# Patient Record
Sex: Male | Born: 1950 | Race: White | Hispanic: No | Marital: Married | Smoking: Former smoker
Health system: Southern US, Community
[De-identification: ages and names within clinical notes are randomized; demographics above are authoritative.]

## PROBLEM LIST (undated history)

## (undated) DIAGNOSIS — I10 Essential (primary) hypertension: Secondary | ICD-10-CM

## (undated) DIAGNOSIS — M1990 Unspecified osteoarthritis, unspecified site: Secondary | ICD-10-CM

## (undated) DIAGNOSIS — K219 Gastro-esophageal reflux disease without esophagitis: Secondary | ICD-10-CM

## (undated) HISTORY — PX: HERNIA REPAIR: SHX51

## (undated) HISTORY — PX: JOINT REPLACEMENT: SHX530

---

## 1995-04-24 HISTORY — PX: SHOULDER SURGERY: SHX246

## 2015-05-10 ENCOUNTER — Emergency Department: Payer: BC Managed Care – PPO

## 2015-05-10 ENCOUNTER — Emergency Department
Admission: EM | Admit: 2015-05-10 | Discharge: 2015-05-10 | Disposition: A | Discharge status: Home / Self Care | Payer: BC Managed Care – PPO | Attending: Emergency Medicine | Admitting: Emergency Medicine

## 2015-05-10 ENCOUNTER — Encounter: Payer: Self-pay | Admitting: Emergency Medicine

## 2015-05-10 DIAGNOSIS — K8020 Calculus of gallbladder without cholecystitis without obstruction: Secondary | ICD-10-CM

## 2015-05-10 DIAGNOSIS — R1011 Right upper quadrant pain: Secondary | ICD-10-CM

## 2015-05-10 DIAGNOSIS — K8070 Calculus of gallbladder and bile duct without cholecystitis without obstruction: Secondary | ICD-10-CM | POA: Insufficient documentation

## 2015-05-10 LAB — COMPREHENSIVE METABOLIC PANEL
ALK PHOS: 64 U/L (ref 38–126)
ALT: 24 U/L (ref 17–63)
AST: 22 U/L (ref 15–41)
Albumin: 4.8 g/dL (ref 3.5–5.0)
Anion gap: 7 (ref 5–15)
BILIRUBIN TOTAL: 0.9 mg/dL (ref 0.3–1.2)
BUN: 16 mg/dL (ref 6–20)
CALCIUM: 9.3 mg/dL (ref 8.9–10.3)
CO2: 28 mmol/L (ref 22–32)
CREATININE: 0.95 mg/dL (ref 0.61–1.24)
Chloride: 104 mmol/L (ref 101–111)
GFR calc Af Amer: >60 mL/min (ref >60)
Glucose, Bld: 105 mg/dL — ABNORMAL HIGH (ref 65–99)
Potassium: 4.2 mmol/L (ref 3.5–5.1)
Sodium: 139 mmol/L (ref 135–145)
TOTAL PROTEIN: 7.9 g/dL (ref 6.5–8.1)

## 2015-05-10 LAB — URINALYSIS COMPLETE WITH MICROSCOPIC (ARMC ONLY)
BILIRUBIN URINE: NEGATIVE
Bacteria, UA: NONE SEEN
GLUCOSE, UA: NEGATIVE mg/dL
HGB URINE DIPSTICK: NEGATIVE
KETONES UR: NEGATIVE mg/dL
LEUKOCYTES UA: NEGATIVE
NITRITE: NEGATIVE
PH: 7 (ref 5.0–8.0)
Protein, ur: NEGATIVE mg/dL
Specific Gravity, Urine: 1.008 (ref 1.005–1.030)
Squamous Epithelial / LPF: NONE SEEN

## 2015-05-10 LAB — CBC
HCT: 49.9 % (ref 40.0–52.0)
Hemoglobin: 16.6 g/dL (ref 13.0–18.0)
MCH: 29.9 pg (ref 26.0–34.0)
MCHC: 33.2 g/dL (ref 32.0–36.0)
MCV: 90 fL (ref 80.0–100.0)
PLATELETS: 194 10*3/uL (ref 150–440)
RBC: 5.54 MIL/uL (ref 4.40–5.90)
RDW: 13.5 % (ref 11.5–14.5)
WBC: 6.3 10*3/uL (ref 3.8–10.6)

## 2015-05-10 LAB — TROPONIN I: Troponin I: <0.03 ng/mL (ref <0.031)

## 2015-05-10 LAB — LIPASE, BLOOD: Lipase: 33 U/L (ref 11–51)

## 2015-05-10 NOTE — ED Provider Notes (Signed)
[facility] Emergency Department Provider Note  ____________________________________________  Time seen: Approximately [id number] AM  I have reviewed the triage vital signs and the nursing notes.   HISTORY  Chief Complaint Abdominal Pain    HPI Bora Ursini is a 65 y.o. male presenting with 4 days of right upper quadrant and right flank pain with worsening this a.m. He says it is worsening with movement and feels like a "spasm." It does not worsen with eating. He has not had any nausea or vomiting. He still has his gallbladder. He denies any pain at this time. He says the pain is been intermittent. No chest pain or shortness of breath.No history of kidney stones. Denies any dysuria.   No past medical history on file.  There are no active problems to display for this patient.   No past surgical history on file.  No current outpatient prescriptions on file.  Allergies Review of patient's allergies indicates no known allergies.  No family history on file.  Social History Social History  Substance Use Topics  . Smoking status: Never Smoker   . Smokeless tobacco: Not on file  . Alcohol Use: Yes    Review of Systems Constitutional: No fever/chills Eyes: No visual changes. ENT: No sore throat. Cardiovascular: Denies chest pain. Respiratory: Denies shortness of breath. Gastrointestinal:  No nausea, no vomiting.  No diarrhea.  No constipation. Genitourinary: Negative for dysuria. Musculoskeletal: Negative for back pain. Skin: Negative for rash. Neurological: Negative for headaches, focal weakness or numbness.  10-point ROS otherwise negative.  ____________________________________________   PHYSICAL EXAM:  VITAL SIGNS: ED Triage Vitals  Enc Vitals Group     BP 05/10/15 0848 165/99 mmHg     Pulse Rate 05/10/15 0847 88     Resp 05/10/15 0847 20     Temp 05/10/15 0847 98 F (36.7 C)     Temp Source 05/10/15 0847 Oral     SpO2 05/10/15 0847  98 %     Weight 05/10/15 0847 197 lb (89.359 kg)     Height 05/10/15 0847 5\' 8"  (1.727 m)     Head Cir --      Peak Flow --      Pain Score 05/10/15 0847 7     Pain Loc --      Pain Edu? --      Excl. in [place]? --     Constitutional: Alert and oriented. Well appearing and in no acute distress. Eyes: Conjunctivae are normal. PERRL. EOMI. Head: Atraumatic. Nose: No congestion/rhinnorhea. Mouth/Throat: Mucous membranes are moist.  Oropharynx non-erythematous. Neck: No stridor.   Cardiovascular: Normal rate, regular rhythm. Grossly normal heart sounds.  Good peripheral circulation. Respiratory: Normal respiratory effort.  No retractions. Lungs CTAB. Gastrointestinal: Soft and nontender. No distention. No abdominal bruits. No CVA tenderness. Musculoskeletal: No lower extremity tenderness nor edema.  No joint effusions. Neurologic:  Normal speech and language. No gross focal neurologic deficits are appreciated. No gait instability. Skin:  Skin is warm, dry and intact. No rash noted. Psychiatric: Mood and affect are normal. Speech and behavior are normal.  ____________________________________________   LABS (all labs ordered are listed, but only abnormal results are displayed)  Labs Reviewed  COMPREHENSIVE METABOLIC PANEL - Abnormal; Notable for the following:    Glucose, Bld 105 (*)    All other components within normal limits  URINALYSIS COMPLETEWITH MICROSCOPIC ([facility] ONLY) - Abnormal; Notable for the following:    Color, Urine YELLOW (*)    APPearance CLEAR (*)  All other components within normal limits  LIPASE, BLOOD  CBC  TROPONIN I   ____________________________________________  EKG  ED ECG REPORT I, Iasiah Ozment,  Youlanda Roys, the attending physician, personally viewed and interpreted this ECG.   Date: 05/10/2015  EKG Time: 904 AM  Rate: 84  Rhythm: normal sinus rhythm  Axis: Normal axis  Intervals:none  ST&T Change: No ST segment elevation or depression. No abnormal  T-wave inversion.  ____________________________________________  RADIOLOGY  Chest x-ray without any acute disease.  Ultrasound of the gallbladder with multiple gallstones and borderline wall thickening without oh sonographic Murphy sign or pericholecystic fluid. ____________________________________________   PROCEDURES    ____________________________________________   INITIAL IMPRESSION / ASSESSMENT AND PLAN / ED COURSE  Pertinent labs & imaging results that were available during my care of the patient were reviewed by me and considered in my medical decision making (see chart for details).  ----------------------------------------- 2:29 PM on 05/10/2015 -----------------------------------------  Discussed the case with Dr. Heath Lark. I was concerned because of the borderline wall thickening. However considering that the patient is very comfortable right now with normal labs and vital signs and without any tenderness to palpation on repeat exam he'll be discharged for outpatient follow-up. I did discuss with him the imaging findings and the concern for gallbladder pain as a cause for his presentation. He understands plan for outpatient follow-up and is willing to comply. He understands return for any worsening or concerning symptoms. Furthermore, denies any shortness of breath or chest pain or pain with deep breathing. He does have the pain is worse in the morning and with movement. I told him that he could try Aspercreme or icy hot to the area to see if it helps the symptoms but I am concerned about this being from his gallstones. ____________________________________________   FINAL CLINICAL IMPRESSION(S) / ED DIAGNOSES  Final diagnoses:  RUQ pain   Cholelithiasis.   Orbie Pyo, MD 05/10/15 [phone]

## 2015-05-10 NOTE — ED Notes (Signed)
Pt c/o pain to RUQ for the past 4 days worsening this am. Pt reports was seen at [facility] and brought to the ED via wheelchair for evaluation.

## 2015-05-10 NOTE — ED Notes (Signed)
He is to be discharged to home with follow up   Pt reports no pain at this time VSS  Continue to monitor

## 2015-05-10 NOTE — Discharge Instructions (Signed)

## 2015-05-11 ENCOUNTER — Encounter: Payer: Self-pay | Admitting: *Deleted

## 2015-05-17 ENCOUNTER — Ambulatory Visit (INDEPENDENT_AMBULATORY_CARE_PROVIDER_SITE_OTHER): Payer: BC Managed Care – PPO | Admitting: General Surgery

## 2015-05-17 ENCOUNTER — Encounter: Payer: Self-pay | Admitting: General Surgery

## 2015-05-17 VITALS — BP 150/74 | HR 84 | Resp 12 | Ht 68.0 in | Wt 197.0 lb

## 2015-05-17 DIAGNOSIS — K8010 Calculus of gallbladder with chronic cholecystitis without obstruction: Secondary | ICD-10-CM

## 2015-05-17 DIAGNOSIS — K429 Umbilical hernia without obstruction or gangrene: Secondary | ICD-10-CM

## 2015-05-17 NOTE — Progress Notes (Signed)
Patient ID: Craig Michael, male   DOB: 1950-07-12, 65 y.o.   MRN: [id number]  Chief Complaint  Patient presents with  . Other    gallstones    HPI Craig Michael is a 65 y.o. male. Patient is here for evaluation of gallstones. States that he had sharp RUQ pain 1 week ago and presented to the ED . [place] on 05/09/15 in the ED shows gall stones. Prior to this episode, he had mild RUQ and LUQ pain intermittently. Denies nausea, vomiting, diarrhea or change in bowel habits. Denies change in diet.   HPI I have reviewed the history of present illness with the patient.  History reviewed. No pertinent past medical history.  Past Surgical History  Procedure Laterality Date  . Shoulder surgery Right 1997    History reviewed. No pertinent family history.  Social History Social History  Substance Use Topics  . Smoking status: Former Smoker -- 1.00 packs/day for 20 years    Types: Cigarettes  . Smokeless tobacco: None  . Alcohol Use: 0.0 oz/week    0 Standard drinks or equivalent per week    No Known Allergies  Current Outpatient Prescriptions  Medication Sig Dispense Refill  . naproxen sodium (ANAPROX) 220 MG tablet Take 220 mg by mouth as needed.    Marland Kitchen omeprazole (PRILOSEC) 20 MG capsule Take 20 mg by mouth daily.     No current facility-administered medications for this visit.    Review of Systems Review of Systems  Constitutional: Negative.   Respiratory: Negative.   Cardiovascular: Negative.     Blood pressure 150/74, pulse 84, resp. rate 12, height 5\' 8"  (1.727 m), weight 197 lb (89.359 kg).  Physical Exam Physical Exam  Constitutional: He is oriented to person, place, and time. He appears well-developed and well-nourished.  Eyes: Conjunctivae are normal. No scleral icterus.  Neck: Neck supple.  Cardiovascular: Normal rate, regular rhythm and normal heart sounds.   Pulmonary/Chest: Effort normal and breath sounds normal.  Abdominal: Soft. Normal appearance and bowel  sounds are normal. There is no hepatomegaly. There is tenderness in the right upper quadrant. A hernia is present. Hernia confirmed negative in the right inguinal area and confirmed negative in the left inguinal area.    Lymphadenopathy:    He has no cervical adenopathy.  Neurological: He is alert and oriented to person, place, and time.  Skin: Skin is warm and dry.    Data Reviewed Notes, Labs, and [place] reviewed. He has gallstones, mild gbw thickening. No pericholecystic fluid Labs were normal. Assessment    Cholelithiasis, chronic cholecystitis Umbilical hernia    Plan   Recommended cholecystectomy, repair of umbilical hernia at same time.  Patient to have laparoscopic cholecystectomy with intraoperative cholangiogram and umbilical hernia repair.  Hernia precautions and incarceration were discussed with the patient. If they develop symptoms of an incarcerated hernia, they were encouraged to seek prompt medical attention.  I have recommended repair of the hernia using mesh on an outpatient basis in the near future. The risk of infection was reviewed. The role of prosthetic mesh to minimize the risk of recurrence was reviewed. Laparoscopic Cholecystectomy with Intraoperative Cholangiogram. The procedure, including it's potential risks and complications (including but not limited to infection, bleeding, injury to intra-abdominal organs or bile ducts, bile leak, poor cosmetic result, sepsis and death) were discussed with the patient in detail. Non-operative options, including their inherent risks (acute calculous cholecystitis with possible choledocholithiasis or gallstone pancreatitis, with the risk of ascending cholangitis, sepsis,  and death) were discussed as well. The patient expressed and understanding of what we discussed and wishes to proceed with laparoscopic cholecystectomy. The patient further understands that if it is technically not possible, or it is unsafe to proceed  laparoscopically, that I will convert to an open cholecystectomy.  Patient is scheduled for surgery at [facility] on 05/24/15. He will pre admit by phone. Patient is aware of date and instructions.     PCP:  No Pcp Per Patient\  This information has been scribed by Gaspar Cola CMA.     SANKAR,SEEPLAPUTHUR G 05/17/2015, 3:21 PM

## 2015-05-17 NOTE — Patient Instructions (Addendum)
   Cholelithiasis Cholelithiasis (also called gallstones) is a form of gallbladder disease in which gallstones form in your gallbladder. The gallbladder is an organ that stores bile made in the liver, which helps digest fats. Gallstones begin as small crystals and slowly grow into stones. Gallstone pain occurs when the gallbladder spasms and a gallstone is blocking the duct. Pain can also occur when a stone passes out of the duct.  RISK FACTORS  Being male.   Having multiple pregnancies. Health care providers sometimes advise removing diseased gallbladders before future pregnancies.   Being obese.  Eating a diet heavy in fried foods and fat.   Being older than 1 years and increasing age.   Prolonged use of medicines containing male hormones.   Having diabetes mellitus.   Rapidly losing weight.   Having a family history of gallstones (heredity).  SYMPTOMS  Nausea.   Vomiting.  Abdominal pain.   Yellowing of the skin (jaundice).   Sudden pain. It may persist from several minutes to several hours.  Fever.   Tenderness to the touch. In some cases, when gallstones do not move into the bile duct, people have no pain or symptoms. These are called "silent" gallstones.  TREATMENT Silent gallstones do not need treatment. In severe cases, emergency surgery may be required. Options for treatment include:  Surgery to remove the gallbladder. This is the most common treatment.  Medicines. These do not always work and may take 6-12 months or more to work.  Shock wave treatment (extracorporeal biliary lithotripsy). In this treatment an ultrasound machine sends shock waves to the gallbladder to break gallstones into smaller pieces that can pass into the intestines or be dissolved by medicine. HOME CARE INSTRUCTIONS   Only take over-the-counter or prescription medicines for pain, discomfort, or fever as directed by your health care provider.   Follow a low-fat diet  until seen again by your health care provider. Fat causes the gallbladder to contract, which can result in pain.   Follow up with your health care provider as directed. Attacks are almost always recurrent and surgery is usually required for permanent treatment.  SEEK IMMEDIATE MEDICAL CARE IF:   Your pain increases and is not controlled by medicines.   You have a fever or persistent symptoms for more than 2-3 days.   You have a fever and your symptoms suddenly get worse.   You have persistent nausea and vomiting.  MAKE SURE YOU:   Understand these instructions.  Will watch your condition.  Will get help right away if you are not doing well or get worse.   This information is not intended to replace advice given to you by your health care provider. Make sure you discuss any questions you have with your health care provider.   Document Released: 04/05/2005 Document Revised: 12/10/2012 Document Reviewed: 10/01/2012 Elsevier Interactive Patient Education [employer].  Patient is scheduled for surgery at [facility] on 05/24/15. He will pre admit by phone. Patient is aware of date and instructions.

## 2015-05-19 ENCOUNTER — Encounter: Payer: Self-pay | Admitting: *Deleted

## 2015-05-19 ENCOUNTER — Inpatient Hospital Stay: Admission: RE | Admit: 2015-05-19 | Payer: BC Managed Care – PPO | Source: Ambulatory Visit

## 2015-05-19 NOTE — Patient Instructions (Signed)
  Your procedure is scheduled on: 05-24-15 Report to [place]. To find out your arrival time please call [phone] between 1PM - 3PM on 05-23-15  Remember: Instructions that are not followed completely may result in serious medical risk, up to and including death, or upon the discretion of your surgeon and anesthesiologist your surgery may need to be rescheduled.    _X___ 1. Do not eat food or drink liquids after midnight. No gum chewing or hard candies.     _X___ 2. No Alcohol for 24 hours before or after surgery.   ____ 3. Bring all medications with you on the day of surgery if instructed.    ____ 4. Notify your doctor if there is any change in your medical condition     (cold, fever, infections).     Do not wear jewelry, make-up, hairpins, clips or nail polish.  Do not wear lotions, powders, or perfumes. You may wear deodorant.  Do not shave 48 hours prior to surgery. Men may shave face and neck.  Do not bring valuables to the hospital.    [facility] is not responsible for any belongings or valuables.               Contacts, dentures or bridgework may not be worn into surgery.  Leave your suitcase in the car. After surgery it may be brought to your room.  For patients admitted to the hospital, discharge time is determined by your treatment team.   Patients discharged the day of surgery will not be allowed to drive home.   Please read over the following fact sheets that you were given:      __X__ Take these medicines the morning of surgery with A SIP OF WATER:    1. OMEPRAZOLE  2. TAKE AN EXTRA OMEPRAZOLE ON Monday NIGHT  3.   4.  5.  6.  ____ Fleet Enema (as directed)   ____ Use CHG Soap as directed  ____ Use inhalers on the day of surgery  ____ Stop metformin 2 days prior to surgery    ____ Take 1/2 of usual insulin dose the night before surgery and none on the morning of surgery.   ____ Stop Coumadin/Plavix/aspirin-N/A  _X___  Stop Anti-inflammatories-STOP NAPROXEN NOW-NO NSAIDS OR ASA PRODUCTS-TYLENOL OK TO TAKE   ____ Stop supplements until after surgery.    ____ Bring C-Pap to the hospital.

## 2015-05-23 ENCOUNTER — Ambulatory Visit: Payer: Self-pay | Admitting: Surgery

## 2015-05-24 ENCOUNTER — Ambulatory Visit: Payer: BC Managed Care – PPO | Admitting: Anesthesiology

## 2015-05-24 ENCOUNTER — Encounter: Payer: Self-pay | Admitting: *Deleted

## 2015-05-24 ENCOUNTER — Ambulatory Visit: Payer: BC Managed Care – PPO

## 2015-05-24 ENCOUNTER — Encounter
Admission: RE | Disposition: A | Discharge status: Home / Self Care | Payer: Self-pay | Source: Ambulatory Visit | Attending: General Surgery

## 2015-05-24 ENCOUNTER — Ambulatory Visit
Admission: RE | Admit: 2015-05-24 | Discharge: 2015-05-24 | Disposition: A | Discharge status: Home / Self Care | Payer: BC Managed Care – PPO | Source: Ambulatory Visit | Attending: General Surgery | Admitting: General Surgery

## 2015-05-24 DIAGNOSIS — Z419 Encounter for procedure for purposes other than remedying health state, unspecified: Secondary | ICD-10-CM

## 2015-05-24 DIAGNOSIS — K219 Gastro-esophageal reflux disease without esophagitis: Secondary | ICD-10-CM | POA: Diagnosis not present

## 2015-05-24 DIAGNOSIS — Z87891 Personal history of nicotine dependence: Secondary | ICD-10-CM | POA: Insufficient documentation

## 2015-05-24 DIAGNOSIS — K8010 Calculus of gallbladder with chronic cholecystitis without obstruction: Secondary | ICD-10-CM | POA: Diagnosis not present

## 2015-05-24 DIAGNOSIS — K429 Umbilical hernia without obstruction or gangrene: Secondary | ICD-10-CM

## 2015-05-24 HISTORY — PX: CHOLECYSTECTOMY: SHX55

## 2015-05-24 HISTORY — DX: Gastro-esophageal reflux disease without esophagitis: K21.9

## 2015-05-24 HISTORY — PX: UMBILICAL HERNIA REPAIR: SHX196

## 2015-05-24 SURGERY — LAPAROSCOPIC CHOLECYSTECTOMY WITH INTRAOPERATIVE CHOLANGIOGRAM
Anesthesia: General | Wound class: Clean Contaminated

## 2015-05-24 MED ORDER — FENTANYL CITRATE (PF) 100 MCG/2ML IJ SOLN
25.0000 ug | INTRAMUSCULAR | Status: DC | PRN
  Administered 2015-05-24 (×5): 25 ug via INTRAVENOUS

## 2015-05-24 MED ORDER — OXYCODONE-ACETAMINOPHEN 5-325 MG PO TABS
1.0000 | ORAL_TABLET | ORAL | Status: DC | PRN
  Administered 2015-05-24: 1 via ORAL

## 2015-05-24 MED ORDER — LACTATED RINGERS IR SOLN
Status: DC | PRN
  Administered 2015-05-24: 100 mL

## 2015-05-24 MED ORDER — SODIUM CHLORIDE 0.9 % IJ SOLN
INTRAMUSCULAR | Status: AC
  Filled 2015-05-24: qty 50

## 2015-05-24 MED ORDER — LIDOCAINE HCL (CARDIAC) 20 MG/ML IV SOLN
INTRAVENOUS | Status: DC | PRN
  Administered 2015-05-24: 30 mg via INTRAVENOUS

## 2015-05-24 MED ORDER — SODIUM CHLORIDE 0.9 % IV SOLN
INTRAVENOUS | Status: DC | PRN
  Administered 2015-05-24: 5 mL

## 2015-05-24 MED ORDER — ONDANSETRON HCL 4 MG/2ML IJ SOLN: 4.0000 mg | Freq: Once | INTRAMUSCULAR | Status: DC | PRN

## 2015-05-24 MED ORDER — PROPOFOL 10 MG/ML IV BOLUS
INTRAVENOUS | Status: DC | PRN
  Administered 2015-05-24: 200 mg via INTRAVENOUS

## 2015-05-24 MED ORDER — OXYCODONE-ACETAMINOPHEN 5-325 MG PO TABS
1.0000 | ORAL_TABLET | ORAL | Status: DC | PRN
Start: 2015-05-24 — End: 2015-05-31

## 2015-05-24 MED ORDER — FENTANYL CITRATE (PF) 100 MCG/2ML IJ SOLN
INTRAMUSCULAR | Status: DC | PRN
  Administered 2015-05-24: 100 ug via INTRAVENOUS

## 2015-05-24 MED ORDER — ROCURONIUM BROMIDE 100 MG/10ML IV SOLN
INTRAVENOUS | Status: DC | PRN
  Administered 2015-05-24: 15 mg via INTRAVENOUS
  Administered 2015-05-24: 35 mg via INTRAVENOUS

## 2015-05-24 MED ORDER — OXYCODONE-ACETAMINOPHEN 5-325 MG PO TABS
ORAL_TABLET | ORAL | Status: AC
  Filled 2015-05-24: qty 1

## 2015-05-24 MED ORDER — FENTANYL CITRATE (PF) 100 MCG/2ML IJ SOLN
INTRAMUSCULAR | Status: AC
  Administered 2015-05-24: 25 ug via INTRAVENOUS
  Filled 2015-05-24: qty 2

## 2015-05-24 MED ORDER — ACETAMINOPHEN 10 MG/ML IV SOLN
INTRAVENOUS | Status: AC
  Filled 2015-05-24: qty 100

## 2015-05-24 MED ORDER — KETOROLAC TROMETHAMINE 30 MG/ML IJ SOLN
INTRAMUSCULAR | Status: DC | PRN
  Administered 2015-05-24: 30 mg via INTRAVENOUS

## 2015-05-24 MED ORDER — ACETAMINOPHEN 10 MG/ML IV SOLN
INTRAVENOUS | Status: DC | PRN
  Administered 2015-05-24: 1000 mg via INTRAVENOUS

## 2015-05-24 MED ORDER — CEFAZOLIN SODIUM-DEXTROSE 2-3 GM-% IV SOLR
INTRAVENOUS | Status: AC
  Administered 2015-05-24: 2 g via INTRAVENOUS
  Filled 2015-05-24: qty 50

## 2015-05-24 MED ORDER — DEXAMETHASONE SODIUM PHOSPHATE 10 MG/ML IJ SOLN
INTRAMUSCULAR | Status: DC | PRN
  Administered 2015-05-24: 10 mg via INTRAVENOUS

## 2015-05-24 MED ORDER — CEFAZOLIN SODIUM-DEXTROSE 2-3 GM-% IV SOLR
2.0000 g | INTRAVENOUS | Status: AC
  Administered 2015-05-24: 2 g via INTRAVENOUS

## 2015-05-24 MED ORDER — MIDAZOLAM HCL 2 MG/2ML IJ SOLN
INTRAMUSCULAR | Status: DC | PRN
  Administered 2015-05-24: 2 mg via INTRAVENOUS

## 2015-05-24 MED ORDER — ONDANSETRON HCL 4 MG/2ML IJ SOLN
INTRAMUSCULAR | Status: DC | PRN
  Administered 2015-05-24: 4 mg via INTRAVENOUS

## 2015-05-24 MED ORDER — SUGAMMADEX SODIUM 200 MG/2ML IV SOLN
INTRAVENOUS | Status: DC | PRN
  Administered 2015-05-24: 180 mg via INTRAVENOUS

## 2015-05-24 MED ORDER — CHLORHEXIDINE GLUCONATE 4 % EX LIQD: 1.0000 "application " | Freq: Once | CUTANEOUS | Status: DC

## 2015-05-24 MED ORDER — LACTATED RINGERS IV SOLN
INTRAVENOUS | Status: DC
  Administered 2015-05-24: 12:00:00 via INTRAVENOUS

## 2015-05-24 SURGICAL SUPPLY — 56 items
ANCHOR TIS RET SYS 235ML (MISCELLANEOUS) ×3 IMPLANT
APPLICATOR SURGIFLO (MISCELLANEOUS) IMPLANT
APPLIER CLIP LOGIC TI 5 (MISCELLANEOUS) ×3 IMPLANT
BENZOIN TINCTURE PRP APPL 2/3 (GAUZE/BANDAGES/DRESSINGS) ×3 IMPLANT
BLADE CLIPPER SURG (BLADE) ×3 IMPLANT
BLADE SURG 11 STRL SS SAFETY (MISCELLANEOUS) ×3 IMPLANT
BLADE SURG 15 STRL SS SAFETY (BLADE) IMPLANT
CANISTER SUCT 1200ML W/VALVE (MISCELLANEOUS) ×3 IMPLANT
CANNULA DILATOR 10 W/SLV (CANNULA) ×2 IMPLANT
CANNULA DILATOR 10MM W/SLV (CANNULA) ×1
CATH CHOLANG 76X19 KUMAR (CATHETERS) ×3 IMPLANT
CHLORAPREP W/TINT 26ML (MISCELLANEOUS) ×3 IMPLANT
CLOSURE WOUND 1/2 X4 (GAUZE/BANDAGES/DRESSINGS) ×1
DECANTER SPIKE VIAL GLASS SM (MISCELLANEOUS) ×3 IMPLANT
DEFOGGER SCOPE WARMER CLEARIFY (MISCELLANEOUS) ×3 IMPLANT
DRAPE C-ARM XRAY 36X54 (DRAPES) ×3 IMPLANT
DRAPE INCISE IOBAN 66X45 STRL (DRAPES) ×3 IMPLANT
DRAPE LAPAROTOMY 100X77 ABD (DRAPES) IMPLANT
DRESSING TELFA 4X3 1S ST N-ADH (GAUZE/BANDAGES/DRESSINGS) ×3 IMPLANT
DRSG TEGADERM 2-3/8X2-3/4 SM (GAUZE/BANDAGES/DRESSINGS) ×3 IMPLANT
ELECT REM PT RETURN 9FT ADLT (ELECTROSURGICAL) ×3
ELECTRODE REM PT RTRN 9FT ADLT (ELECTROSURGICAL) ×1 IMPLANT
GLOVE BIO SURGEON STRL SZ7 (GLOVE) ×18 IMPLANT
GOWN STRL REUS W/ TWL LRG LVL3 (GOWN DISPOSABLE) ×3 IMPLANT
GOWN STRL REUS W/TWL LRG LVL3 (GOWN DISPOSABLE) ×6
GRASPER SUT TROCAR 14GX15 (MISCELLANEOUS) ×3 IMPLANT
HEMOSTAT SURGICEL 2X3 (HEMOSTASIS) IMPLANT
IRRIGATION STRYKERFLOW (MISCELLANEOUS) ×1 IMPLANT
IRRIGATOR STRYKERFLOW (MISCELLANEOUS) ×3
IV LACTATED RINGERS 1000ML (IV SOLUTION) ×3 IMPLANT
KIT RM TURNOVER STRD PROC AR (KITS) ×3 IMPLANT
LABEL OR SOLS (LABEL) ×3 IMPLANT
LIQUID BAND (GAUZE/BANDAGES/DRESSINGS) IMPLANT
NDL INSUFF ACCESS 14 VERSASTEP (NEEDLE) ×3 IMPLANT
NEEDLE HYPO 25X1 1.5 SAFETY (NEEDLE) IMPLANT
NS IRRIG 500ML POUR BTL (IV SOLUTION) ×3 IMPLANT
PACK BASIN MINOR ARMC (MISCELLANEOUS) IMPLANT
PACK LAP CHOLECYSTECTOMY (MISCELLANEOUS) ×3 IMPLANT
SCISSORS METZENBAUM CVD 33 (INSTRUMENTS) ×3 IMPLANT
SLEEVE ENDOPATH XCEL 5M (ENDOMECHANICALS) ×6 IMPLANT
SPOGE SURGIFLO 8M (HEMOSTASIS)
SPONGE SURGIFLO 8M (HEMOSTASIS) IMPLANT
STRIP CLOSURE SKIN 1/2X4 (GAUZE/BANDAGES/DRESSINGS) ×2 IMPLANT
SUT PROLENE 0 CT 2 (SUTURE) ×3 IMPLANT
SUT VIC AB 0 SH 27 (SUTURE) ×3 IMPLANT
SUT VIC AB 2-0 SH 27 (SUTURE)
SUT VIC AB 2-0 SH 27XBRD (SUTURE) IMPLANT
SUT VIC AB 3-0 SH 27 (SUTURE)
SUT VIC AB 3-0 SH 27X BRD (SUTURE) IMPLANT
SUT VIC AB 4-0 FS2 27 (SUTURE) ×6 IMPLANT
SUT VICRYL+ 3-0 144IN (SUTURE) IMPLANT
SWABSTK COMLB BENZOIN TINCTURE (MISCELLANEOUS) IMPLANT
SYR CONTROL 10ML (SYRINGE) IMPLANT
TROCAR XCEL 12X100 BLDLESS (ENDOMECHANICALS) ×3 IMPLANT
TROCAR XCEL NON-BLD 5MMX100MML (ENDOMECHANICALS) ×3 IMPLANT
TUBING INSUFFLATOR HI FLOW (MISCELLANEOUS) ×3 IMPLANT

## 2015-05-24 NOTE — Anesthesia Preprocedure Evaluation (Addendum)
Anesthesia Evaluation  Patient identified by MRN, date of birth, ID band Patient awake    Reviewed: Allergy & Precautions, NPO status , Patient's Chart, lab work & pertinent test results  History of Anesthesia Complications Negative for: history of anesthetic complications  Airway Mallampati: II       Dental   Pulmonary neg pulmonary ROS, former smoker,           Cardiovascular negative cardio ROS       Neuro/Psych negative neurological ROS     GI/Hepatic Neg liver ROS, GERD  Medicated,  Endo/Other  negative endocrine ROS  Renal/GU negative Renal ROS     Musculoskeletal   Abdominal   Peds  Hematology negative hematology ROS (+)   Anesthesia Other Findings   Reproductive/Obstetrics                            Anesthesia Physical Anesthesia Plan  ASA: II  Anesthesia Plan: General   Post-op Pain Management:    Induction: Intravenous  Airway Management Planned: Oral ETT  Additional Equipment:   Intra-op Plan:   Post-operative Plan:   Informed Consent: I have reviewed the patients History and Physical, chart, labs and discussed the procedure including the risks, benefits and alternatives for the proposed anesthesia with the patient or authorized representative who has indicated his/her understanding and acceptance.     Plan Discussed with:   Anesthesia Plan Comments:        Anesthesia Quick Evaluation

## 2015-05-24 NOTE — Anesthesia Procedure Notes (Addendum)
Procedure Name: Intubation Date/Time: 05/24/2015 12:57 PM Performed by: Jonna Clark Pre-anesthesia Checklist: Patient identified, Patient being monitored, Timeout performed, Emergency Drugs available and Suction available Patient Re-evaluated:Patient Re-evaluated prior to inductionOxygen Delivery Method: Circle system utilized Preoxygenation: Pre-oxygenation with 100% oxygen Intubation Type: IV induction Ventilation: Mask ventilation without difficulty Laryngoscope Size: Mac and 4 Grade View: Grade I Tube type: Oral Tube size: 7.5 mm Number of attempts: 1 Airway Equipment and Method: Stylet Placement Confirmation: ETT inserted through vocal cords under direct vision,  positive ETCO2 and breath sounds checked- equal and bilateral Secured at: 21 cm Tube secured with: Tape Dental Injury: Teeth and Oropharynx as per pre-operative assessment    used oral airway to mask properly

## 2015-05-24 NOTE — Discharge Instructions (Signed)

## 2015-05-24 NOTE — Interval H&P Note (Signed)
History and Physical Interval Note:  05/24/2015 11:39 AM  Craig Michael  has presented today for surgery, with the diagnosis of cholelithiasis,umbilical hernia  The various methods of treatment have been discussed with the patient and family. After consideration of risks, benefits and other options for treatment, the patient has consented to  Procedure(s): LAPAROSCOPIC CHOLECYSTECTOMY WITH INTRAOPERATIVE CHOLANGIOGRAM (N/A) HERNIA REPAIR UMBILICAL ADULT (N/A) as a surgical intervention .  The patient's history has been reviewed, patient examined, no change in status, stable for surgery.  I have reviewed the patient's chart and labs.  Questions were answered to the patient's satisfaction.     Deosha Werden G

## 2015-05-24 NOTE — H&P (View-Only) (Signed)
Patient ID: Craig Michael, male   DOB: [birth year], 65 y.o.   MRN: [id number]  Chief Complaint  Patient presents with  . Other    gallstones    HPI Craig Michael is a 65 y.o. male. Patient is here for evaluation of gallstones. States that he had sharp RUQ pain 1 week ago and presented to the ED . [place] on 05/09/15 in the ED shows gall stones. Prior to this episode, he had mild RUQ and LUQ pain intermittently. Denies nausea, vomiting, diarrhea or change in bowel habits. Denies change in diet.   HPI I have reviewed the history of present illness with the patient.  History reviewed. No pertinent past medical history.  Past Surgical History  Procedure Laterality Date  . Shoulder surgery Right 1997    History reviewed. No pertinent family history.  Social History Social History  Substance Use Topics  . Smoking status: Former Smoker -- 1.00 packs/day for 20 years    Types: Cigarettes  . Smokeless tobacco: None  . Alcohol Use: 0.0 oz/week    0 Standard drinks or equivalent per week    No Known Allergies  Current Outpatient Prescriptions  Medication Sig Dispense Refill  . naproxen sodium (ANAPROX) 220 MG tablet Take 220 mg by mouth as needed.    Marland Kitchen omeprazole (PRILOSEC) 20 MG capsule Take 20 mg by mouth daily.     No current facility-administered medications for this visit.    Review of Systems Review of Systems  Constitutional: Negative.   Respiratory: Negative.   Cardiovascular: Negative.     Blood pressure 150/74, pulse 84, resp. rate 12, height 5\' 8"  (1.727 m), weight 197 lb (89.359 kg).  Physical Exam Physical Exam  Constitutional: He is oriented to person, place, and time. He appears well-developed and well-nourished.  Eyes: Conjunctivae are normal. No scleral icterus.  Neck: Neck supple.  Cardiovascular: Normal rate, regular rhythm and normal heart sounds.   Pulmonary/Chest: Effort normal and breath sounds normal.  Abdominal: Soft. Normal appearance and bowel  sounds are normal. There is no hepatomegaly. There is tenderness in the right upper quadrant. A hernia is present. Hernia confirmed negative in the right inguinal area and confirmed negative in the left inguinal area.    Lymphadenopathy:    He has no cervical adenopathy.  Neurological: He is alert and oriented to person, place, and time.  Skin: Skin is warm and dry.    Data Reviewed Notes, Labs, and [place] reviewed. He has gallstones, mild gbw thickening. No pericholecystic fluid Labs were normal. Assessment    Cholelithiasis, chronic cholecystitis Umbilical hernia    Plan   Recommended cholecystectomy, repair of umbilical hernia at same time.  Patient to have laparoscopic cholecystectomy with intraoperative cholangiogram and umbilical hernia repair.  Hernia precautions and incarceration were discussed with the patient. If they develop symptoms of an incarcerated hernia, they were encouraged to seek prompt medical attention.  I have recommended repair of the hernia using mesh on an outpatient basis in the near future. The risk of infection was reviewed. The role of prosthetic mesh to minimize the risk of recurrence was reviewed. Laparoscopic Cholecystectomy with Intraoperative Cholangiogram. The procedure, including it's potential risks and complications (including but not limited to infection, bleeding, injury to intra-abdominal organs or bile ducts, bile leak, poor cosmetic result, sepsis and death) were discussed with the patient in detail. Non-operative options, including their inherent risks (acute calculous cholecystitis with possible choledocholithiasis or gallstone pancreatitis, with the risk of ascending cholangitis, sepsis,  and death) were discussed as well. The patient expressed and understanding of what we discussed and wishes to proceed with laparoscopic cholecystectomy. The patient further understands that if it is technically not possible, or it is unsafe to proceed  laparoscopically, that I will convert to an open cholecystectomy.  Patient is scheduled for surgery at [facility] on 05/24/15. He will pre admit by phone. Patient is aware of date and instructions.     PCP:  No Pcp Per Patient\  This information has been scribed by Gaspar Cola CMA.     SANKAR,SEEPLAPUTHUR G 05/17/2015, 3:21 PM

## 2015-05-24 NOTE — Op Note (Signed)
Preop diagnosis: Cholelithiasis with chronic cholecystitis. Umbilical hernia  Post op diagnosis: Same  Operation: Laparoscopy cholecystectomy with intraoperative cholangiogram and repair umbilical hernia  Surgeon: S.G.Sankar  Assistant:     Anesthesia: Gen.  Complications: None  EBL: Less than 20 mL  Drains: None  Description: Patient was put to sleep in supine position the operating table the abdomen was prepped and draped sterile field. Timeout was performed. The patient had a 1.5 cm hernial defect with a 2 cm hernia right at the umbilicus. An incision was made out made along the upper lip of the umbilicus to allow for adequate exposure of hernial sac. The sac was opened and noted contain just a fatty layer of likely preperitoneal fat but no other contents were noted and it was free entry into the peritoneal cavity. The sac was freed and excised out. A 12 mm port was then placed at the site and the camera and pneumoperitoneum was obtained. With the camera in place epigastric and 2 lateral 5 mm ports were placed gallbladder was noted to be lying medial to the falciform ligament attachment to the liver which is somewhat unusual but otherwise no significant other abnormalities were noted. Gallbladder showed no acute changes but there was evidence of some scarring and thickening consistent with chronic cholecystitis. With careful exposure and cephalad traction the Hartman's pouch was lifted up the cystic duct was then identified and freed 2 small branches and the cystic artery were also identified and these were clipped and cut, a Kumar clamp and catheter was positioned and cholangiogram was completed. There was good visualization of the common hepatic and duct and its radicles as also the common bile duct with free entry into the duodenum. The catheter was then used to decompress the gallbladder removed. The cystic duct was hemoclipped and cut the main cystic artery was identified freed hemoclipped  and cut. Gallbladder was then dissected free from its bed using cautery for control of bleeding the right upper quadrant was irrigated with a small amount of saline and suctioned out hemostasis was intact 5 mm scope was used in the epigastric port site in the gallbladder placed in a retrieval bag and brought out through the umbilical port site. Stones were palpable within the gallbladder which was then sent in formalin for pathology. At this point the fascial defect of the umbilicus was well outlined and repaired with 2 figure-of-eight stitches of 0. Prolene with an adequate closure of this defect. With the scope in place the repair side was inspected no underlying bowel adhesions or involvement of the repair was noted and again hemostasis in the gallbladder bed was noted remaining fluid around the liver was suctioned out. The ports were then removed and pneumoperitoneum was released. Skin incision was then closed with subcuticular 4-0 Vicryl reinforced with Steri-Strips and tincture benzoin. Telfa and Tegaderm dressings were placed. The patient subsequently returned recovery room stable condition.Marland Kitchen

## 2015-05-24 NOTE — Transfer of Care (Signed)
Immediate Anesthesia Transfer of Care Note  Patient: Craig Michael  Procedure(s) Performed: Procedure(s): LAPAROSCOPIC CHOLECYSTECTOMY WITH INTRAOPERATIVE CHOLANGIOGRAM (N/A) HERNIA REPAIR UMBILICAL ADULT (N/A)  Patient Location: PACU  Anesthesia Type:General  Level of Consciousness: awake, alert , oriented and patient cooperative  Airway & Oxygen Therapy: Patient Spontanous Breathing and Patient connected to face mask oxygen  Post-op Assessment: Report given to RN, Post -op Vital signs reviewed and stable and Patient moving all extremities X 4  Post vital signs: Reviewed and stable  Last Vitals:  Filed Vitals:   05/24/15 1111  BP: 157/86  Pulse: 75  Temp: 36.6 C  Resp: 12    Complications: No apparent anesthesia complications

## 2015-05-24 NOTE — Anesthesia Postprocedure Evaluation (Signed)
Anesthesia Post Note  Patient: Craig Michael  Procedure(s) Performed: Procedure(s) (LRB): LAPAROSCOPIC CHOLECYSTECTOMY WITH INTRAOPERATIVE CHOLANGIOGRAM (N/A) HERNIA REPAIR UMBILICAL ADULT (N/A)  Patient location during evaluation: PACU Anesthesia Type: General Level of consciousness: awake and alert Pain management: pain level controlled Vital Signs Assessment: post-procedure vital signs reviewed and stable Respiratory status: spontaneous breathing and respiratory function stable Cardiovascular status: stable Anesthetic complications: no    Last Vitals:  Filed Vitals:   05/24/15 1457 05/24/15 1516  BP: 134/87 141/81  Pulse: 71 72  Temp: 36.7 C 36.7 C  Resp: 14 16    Last Pain:  Filed Vitals:   05/24/15 1517  PainSc: 4                  Wendy Hoback K

## 2015-05-26 LAB — SURGICAL PATHOLOGY

## 2015-05-31 ENCOUNTER — Encounter: Payer: Self-pay | Admitting: General Surgery

## 2015-05-31 ENCOUNTER — Ambulatory Visit (INDEPENDENT_AMBULATORY_CARE_PROVIDER_SITE_OTHER): Payer: BC Managed Care – PPO | Admitting: General Surgery

## 2015-05-31 VITALS — BP 158/82 | HR 92 | Resp 12 | Ht 67.0 in | Wt 193.0 lb

## 2015-05-31 DIAGNOSIS — K8010 Calculus of gallbladder with chronic cholecystitis without obstruction: Secondary | ICD-10-CM

## 2015-05-31 DIAGNOSIS — K429 Umbilical hernia without obstruction or gangrene: Secondary | ICD-10-CM

## 2015-05-31 MED ORDER — OMEPRAZOLE 20 MG PO CPDR: 20.0000 mg | DELAYED_RELEASE_CAPSULE | ORAL | Status: DC

## 2015-05-31 NOTE — Progress Notes (Signed)
This is a 65 year old male here today for his post op umbilical hernia and gallbladder removal done on 05/24/15. Patient states he is doing well with no problems, he is not having pain like he was prior to surgery. He started back to work today. I have reviewed the history of present illness with the patient.  Port sites are clean and hernia repair is intact. No drainage. Lungs clear to auscultation bilaterally. Patient to return as needed.  Rx for omeprazole sent to pharmacy. Patient advised to obtain a PCP for continued prescription.  PCP:  No Pcp .This information has been scribed by Gaspar Cola CMA.

## 2015-05-31 NOTE — Patient Instructions (Signed)
Return as needed. No diet or activity restrictions.

## 2015-09-09 DIAGNOSIS — K219 Gastro-esophageal reflux disease without esophagitis: Secondary | ICD-10-CM | POA: Insufficient documentation

## 2016-02-15 DIAGNOSIS — I10 Essential (primary) hypertension: Secondary | ICD-10-CM | POA: Insufficient documentation

## 2016-02-29 ENCOUNTER — Other Ambulatory Visit: Payer: Self-pay | Admitting: General Surgery

## 2016-11-14 DIAGNOSIS — M109 Gout, unspecified: Secondary | ICD-10-CM | POA: Insufficient documentation

## 2016-11-14 DIAGNOSIS — R7302 Impaired glucose tolerance (oral): Secondary | ICD-10-CM | POA: Insufficient documentation

## 2017-12-18 ENCOUNTER — Emergency Department
Admission: EM | Admit: 2017-12-18 | Discharge: 2017-12-18 | Disposition: A | Discharge status: Home / Self Care | Payer: No Typology Code available for payment source | Attending: Emergency Medicine | Admitting: Emergency Medicine

## 2017-12-18 ENCOUNTER — Encounter: Payer: Self-pay | Admitting: Emergency Medicine

## 2017-12-18 ENCOUNTER — Emergency Department: Payer: No Typology Code available for payment source

## 2017-12-18 ENCOUNTER — Other Ambulatory Visit: Payer: Self-pay

## 2017-12-18 DIAGNOSIS — S2242XA Multiple fractures of ribs, left side, initial encounter for closed fracture: Secondary | ICD-10-CM | POA: Diagnosis not present

## 2017-12-18 DIAGNOSIS — Y929 Unspecified place or not applicable: Secondary | ICD-10-CM | POA: Diagnosis not present

## 2017-12-18 DIAGNOSIS — Y990 Civilian activity done for income or pay: Secondary | ICD-10-CM | POA: Insufficient documentation

## 2017-12-18 DIAGNOSIS — Z79899 Other long term (current) drug therapy: Secondary | ICD-10-CM | POA: Diagnosis not present

## 2017-12-18 DIAGNOSIS — W010XXA Fall on same level from slipping, tripping and stumbling without subsequent striking against object, initial encounter: Secondary | ICD-10-CM | POA: Insufficient documentation

## 2017-12-18 DIAGNOSIS — Z87891 Personal history of nicotine dependence: Secondary | ICD-10-CM | POA: Diagnosis not present

## 2017-12-18 DIAGNOSIS — Y9301 Activity, walking, marching and hiking: Secondary | ICD-10-CM | POA: Diagnosis not present

## 2017-12-18 DIAGNOSIS — S299XXA Unspecified injury of thorax, initial encounter: Secondary | ICD-10-CM | POA: Diagnosis present

## 2017-12-18 MED ORDER — OXYCODONE-ACETAMINOPHEN 7.5-325 MG PO TABS: 1.0000 | ORAL_TABLET | Freq: Four times a day (QID) | ORAL | 0 refills | Status: DC | PRN

## 2017-12-18 MED ORDER — NAPROXEN 500 MG PO TABS: 500.0000 mg | ORAL_TABLET | Freq: Two times a day (BID) | ORAL | Status: DC

## 2017-12-18 NOTE — ED Notes (Signed)
See triage note  States he fell yesterday and hit a metal pan   Having pain to left lateral rib area

## 2017-12-18 NOTE — ED Triage Notes (Signed)
Pt states he slipped and fell yesterday am at work and landed on his left side, pain in his left ribs and left arm, appears in NAD. Workers Tax adviser.

## 2017-12-18 NOTE — ED Notes (Addendum)
Spoke with K.McPherson concerning wc   States drug screen not needed

## 2017-12-18 NOTE — ED Provider Notes (Signed)
[facility] - Inpatient Emergency Department Provider Note   ____________________________________________   First MD Initiated Contact with Patient 12/18/17 [phone]     (approximate)  I have reviewed the triage vital signs and the nursing notes.   HISTORY  Chief Complaint Fall    HPI Craig Michael is a 67 y.o. male patient complain of left rib and left arm pain secondary to a slip and fall yesterday.  Incident occurred at work.  Patient rates his pain as a 5/10.  Patient described the pain is "aching".  Patient diarrhea pain increases with deep inspirations.  Patient denies shortness of breath or chest pain.  Past Medical History:  Diagnosis Date  . GERD (gastroesophageal reflux disease)     There are no active problems to display for this patient.   Past Surgical History:  Procedure Laterality Date  . CHOLECYSTECTOMY N/A 05/24/2015   Procedure: LAPAROSCOPIC CHOLECYSTECTOMY WITH INTRAOPERATIVE CHOLANGIOGRAM;  Surgeon: Christene Lye, MD;  Location: [facility] ORS;  Service: General;  Laterality: N/A;  . SHOULDER SURGERY Right 1997  . UMBILICAL HERNIA REPAIR N/A 05/24/2015   Procedure: HERNIA REPAIR UMBILICAL ADULT;  Surgeon: Christene Lye, MD;  Location: [facility] ORS;  Service: General;  Laterality: N/A;    Prior to Admission medications   Medication Sig Start Date End Date Taking? Authorizing Provider  colchicine 0.6 MG tablet Take 0.6 mg by mouth daily.   Yes [provider]  cyclobenzaprine (FLEXERIL) 5 MG tablet Take 5 mg by mouth 3 (three) times daily as needed for muscle spasms.   Yes [provider]  lisinopril (PRINIVIL,ZESTRIL) 10 MG tablet Take 10 mg by mouth daily.   Yes [provider]  meloxicam (MOBIC) 7.5 MG tablet Take 7.5 mg by mouth daily.   Yes [provider]  naproxen (NAPROSYN) 500 MG tablet Take 1 tablet (500 mg total) by mouth 2 (two) times daily with a meal. 12/18/17   Sable Feil, PA-C    naproxen sodium (ANAPROX) 220 MG tablet Take 220 mg by mouth as needed.    [provider]  omeprazole (PRILOSEC) 20 MG capsule Take 1 capsule (20 mg total) by mouth every morning. 05/31/15   Christene Lye, MD  oxyCODONE-acetaminophen (PERCOCET) 7.5-325 MG tablet Take 1 tablet by mouth every 6 (six) hours as needed. 12/18/17   Sable Feil, PA-C    Allergies Patient has no known allergies.  No family history on file.  Social History Social History   Tobacco Use  . Smoking status: Former Smoker    Packs/day: 1.00    Years: 20.00    Pack years: 20.00    Types: Cigarettes    Last attempt to quit: 05/18/2005    Years since quitting: 12.5  . Smokeless tobacco: Never Used  Substance Use Topics  . Alcohol use: Yes    Alcohol/week: 0.0 standard drinks    Comment: BOURBAN ALMOST DAILY  . Drug use: No    Review of Systems  Constitutional: No fever/chills Eyes: No visual changes. ENT: No sore throat. Cardiovascular: Denies chest pain. Respiratory: Denies shortness of breath. Gastrointestinal: No abdominal pain.  No nausea, no vomiting.  No diarrhea.  No constipation. Genitourinary: Negative for dysuria. Musculoskeletal: Left rib pain shoulder pain. Skin: Negative for rash. Neurological: Negative for headaches, focal weakness or numbness. Endocrine:Hypertension   ____________________________________________   PHYSICAL EXAM:  VITAL SIGNS: ED Triage Vitals [12/18/17 0806]  Enc Vitals Group     BP  Pulse      Resp      Temp      Temp src      SpO2      Weight 190 lb (86.2 kg)     Height 5\' 7"  (1.702 m)     Head Circumference      Peak Flow      Pain Score 5     Pain Loc      Pain Edu?      Excl. in [place]?    Constitutional: Alert and oriented. Well appearing and in no acute distress. Neck: No stridor.  No cervical spine tenderness to palpation. Hematological/Lymphatic/Immunilogical: No cervical lymphadenopathy. Cardiovascular: Normal rate,  regular rhythm. Grossly normal heart sounds.  Good peripheral circulation. Respiratory: Normal respiratory effort.  No retractions. Lungs CTAB. Gastrointestinal: Soft and nontender. No distention. No abdominal bruits. No CVA tenderness. Musculoskeletal: No obvious deformity to the left lateral ribs or left shoulder. Neurologic:  Normal speech and language. No gross focal neurologic deficits are appreciated. No gait instability. Skin:  Skin is warm, dry and intact. No rash noted.  Ecchymosis left lateral rib. Psychiatric: Mood and affect are normal. Speech and behavior are normal.  ____________________________________________   LABS (all labs ordered are listed, but only abnormal results are displayed)  Labs Reviewed - No data to display ____________________________________________  EKG  EKG read by heart station ducted with no acute findings. ____________________________________________  RADIOLOGY  ED MD interpretation:    Official radiology report(s): Dg Ribs Unilateral W/chest Left  Result Date: 12/18/2017 CLINICAL DATA:  Fall yesterday, left rib and left arm pain with ecchymoses, initial encounter. EXAM: LEFT RIBS AND CHEST - 3+ VIEW COMPARISON:  05/10/2015. FINDINGS: Trachea is midline. Heart size normal. Minimal bibasilar subsegmental atelectasis, left greater than right. No airspace consolidation or pleural fluid. No pneumothorax. Dedicated views of the left ribs show nondisplaced fractures of the lateral aspects of the left seventh and eighth ribs, with overlying subcutaneous emphysema. Degenerative changes in the spine. IMPRESSION: 1. Acute nondisplaced lateral left seventh and eighth rib fractures with overlying subcutaneous emphysema. 2. Minimal left basilar atelectasis. Electronically Signed   By: Lorin Picket M.D.   On: 12/18/2017 09:54    ____________________________________________   PROCEDURES  Procedure(s) performed: None  Procedures  Critical Care  performed: No  ____________________________________________   INITIAL IMPRESSION / ASSESSMENT AND PLAN / ED COURSE  As part of my medical decision making, I reviewed the following data within the electronic MEDICAL RECORD NUMBER    Left lateral chest wall pain secondary to seventh and eighth nondisplaced rib fractures.  Discussed x-ray findings with patient.  Patient given discharge care instruction advised take medication as directed.  Patient advised to follow-up with Worker's Comp. doctor.     ____________________________________________   FINAL CLINICAL IMPRESSION(S) / ED DIAGNOSES  Final diagnoses:  Closed fracture of multiple ribs of left side, initial encounter     ED Discharge Orders         Ordered    oxyCODONE-acetaminophen (PERCOCET) 7.5-325 MG tablet  Every 6 hours PRN     12/18/17 0914    naproxen (NAPROSYN) 500 MG tablet  2 times daily with meals     12/18/17 0914           Note:  This document was prepared using Dragon voice recognition software and may include unintentional dictation errors.    Sable Feil, PA-C 12/18/17 1311    Earleen Newport, MD 12/18/17 1455

## 2018-04-02 DIAGNOSIS — M1711 Unilateral primary osteoarthritis, right knee: Secondary | ICD-10-CM | POA: Insufficient documentation

## 2019-01-09 NOTE — Discharge Instructions (Signed)
°  Instructions after Total Knee Replacement ° ° [place] Laneve P. Jolette Lana, Jr., M.D.    ° Dept. of Orthopaedics & Sports Medicine ° [facility] ° [address] ° [place], [place]  [address] ° Phone: [phone]   Fax: [phone] ° °  °DIET: °• Drink plenty of non-alcoholic fluids. °• Resume your normal diet. Include foods high in fiber. ° °ACTIVITY:  °• You may use crutches or a walker with weight-bearing as tolerated, unless instructed otherwise. °• You may be weaned off of the walker or crutches by your Physical Therapist.  °• Do NOT place pillows under the knee. Anything placed under the knee could limit your ability to straighten the knee.   °• Continue doing gentle exercises. Exercising will reduce the pain and swelling, increase motion, and prevent muscle weakness.   °• Please continue to use the TED compression stockings for 6 weeks. You may remove the stockings at night, but should reapply them in the morning. °• Do not drive or operate any equipment until instructed. ° °WOUND CARE:  °• Continue to use the PolarCare or ice packs periodically to reduce pain and swelling. °• You may bathe or shower after the staples are removed at the first office visit following surgery. ° °MEDICATIONS: °• You may resume your regular medications. °• Please take the pain medication as prescribed on the medication. °• Do not take pain medication on an empty stomach. °• You have been given a prescription for a blood thinner (Lovenox or Coumadin). Please take the medication as instructed. (NOTE: After completing a 2 week course of Lovenox, take one Enteric-coated aspirin once a day. This along with elevation will help reduce the possibility of phlebitis in your operated leg.) °• Do not drive or drink alcoholic beverages when taking pain medications. ° °CALL THE OFFICE FOR: °• Temperature above 101 degrees °• Excessive bleeding or drainage on the dressing. °• Excessive swelling, coldness, or paleness of the toes. °• Persistent  nausea and vomiting. ° °FOLLOW-UP:  °• You should have an appointment to return to the office in 10-14 days after surgery. °• Arrangements have been made for continuation of Physical Therapy (either home therapy or outpatient therapy). °  °

## 2019-01-19 ENCOUNTER — Other Ambulatory Visit: Payer: Self-pay

## 2019-01-19 ENCOUNTER — Encounter
Admission: RE | Admit: 2019-01-19 | Discharge: 2019-01-19 | Disposition: A | Discharge status: Home / Self Care | Payer: Medicare HMO | Source: Ambulatory Visit | Attending: Orthopedic Surgery | Admitting: Orthopedic Surgery

## 2019-01-19 DIAGNOSIS — Z01818 Encounter for other preprocedural examination: Secondary | ICD-10-CM | POA: Insufficient documentation

## 2019-01-19 HISTORY — DX: Unspecified osteoarthritis, unspecified site: M19.90

## 2019-01-19 HISTORY — DX: Essential (primary) hypertension: I10

## 2019-01-19 LAB — CBC
HCT: 42.4 % (ref 39.0–52.0)
Hemoglobin: 14.3 g/dL (ref 13.0–17.0)
MCH: 31.3 pg (ref 26.0–34.0)
MCHC: 33.7 g/dL (ref 30.0–36.0)
MCV: 92.8 fL (ref 80.0–100.0)
Platelets: 206 10*3/uL (ref 150–400)
RBC: 4.57 MIL/uL (ref 4.22–5.81)
RDW: 13.1 % (ref 11.5–15.5)
WBC: 6.1 10*3/uL (ref 4.0–10.5)
nRBC: 0 % (ref 0.0–0.2)

## 2019-01-19 LAB — COMPREHENSIVE METABOLIC PANEL
ALT: 25 U/L (ref 0–44)
AST: 22 U/L (ref 15–41)
Albumin: 4.1 g/dL (ref 3.5–5.0)
Alkaline Phosphatase: 52 U/L (ref 38–126)
Anion gap: 9 (ref 5–15)
BUN: 21 mg/dL (ref 8–23)
CO2: 24 mmol/L (ref 22–32)
Calcium: 9 mg/dL (ref 8.9–10.3)
Chloride: 106 mmol/L (ref 98–111)
Creatinine, Ser: 1.01 mg/dL (ref 0.61–1.24)
GFR calc Af Amer: >60 mL/min (ref >60)
GFR calc non Af Amer: >60 mL/min (ref >60)
Glucose, Bld: 101 mg/dL — ABNORMAL HIGH (ref 70–99)
Potassium: 3.9 mmol/L (ref 3.5–5.1)
Sodium: 139 mmol/L (ref 135–145)
Total Bilirubin: 0.7 mg/dL (ref 0.3–1.2)
Total Protein: 7 g/dL (ref 6.5–8.1)

## 2019-01-19 LAB — TYPE AND SCREEN
ABO/RH(D): B POS
Antibody Screen: NEGATIVE

## 2019-01-19 LAB — PROTIME-INR
INR: 0.9 (ref 0.8–1.2)
Prothrombin Time: 12.1 seconds (ref 11.4–15.2)

## 2019-01-19 LAB — SURGICAL PCR SCREEN
MRSA, PCR: NEGATIVE
Staphylococcus aureus: NEGATIVE

## 2019-01-19 LAB — URINALYSIS, ROUTINE W REFLEX MICROSCOPIC
Bilirubin Urine: NEGATIVE
Glucose, UA: NEGATIVE mg/dL
Hgb urine dipstick: NEGATIVE
Ketones, ur: NEGATIVE mg/dL
Leukocytes,Ua: NEGATIVE
Nitrite: NEGATIVE
Protein, ur: NEGATIVE mg/dL
Specific Gravity, Urine: 1.023 (ref 1.005–1.030)
pH: 5 (ref 5.0–8.0)

## 2019-01-19 LAB — SEDIMENTATION RATE: Sed Rate: 7 mm/hr (ref 0–20)

## 2019-01-19 LAB — C-REACTIVE PROTEIN: CRP: <0.8 mg/dL (ref <1.0)

## 2019-01-19 LAB — APTT: aPTT: 24 seconds (ref 24–36)

## 2019-01-19 MED ORDER — ENSURE PRE-SURGERY PO LIQD
296.0000 mL | Freq: Once | ORAL | Status: DC
  Filled 2019-01-19: qty 296

## 2019-01-19 NOTE — Pre-Procedure Instructions (Signed)
Incentive spirometry and carbohydrate drink given along with instructions.

## 2019-01-19 NOTE — Patient Instructions (Addendum)
Your procedure is scheduled on: 01/26/2019 Mon Report to Same Day Surgery 2nd floor medical mall [facility] Entrance-take elevator on left to 2nd floor.  Check in with surgery information desk.) To find out your arrival time please call [phone] between 1PM - 3PM on 01/23/2019 Fri  Remember: Instructions that are not followed completely may result in serious medical risk, up to and including death, or upon the discretion of your surgeon and anesthesiologist your surgery may need to be rescheduled.    _x___ 1. Do not eat food after midnight the night before your procedure. You may drink clear liquids up to 2 hours before you are scheduled to arrive at the hospital for your procedure.  Do not drink clear liquids within 2 hours of your scheduled arrival to the hospital.  Clear liquids include  --Water or Apple juice without pulp  --Clear carbohydrate beverage such as ClearFast or Gatorade  --Black Coffee or Clear Tea (No milk, no creamers, do not add anything to                  the coffee or Tea Type 1 and type 2 diabetics should only drink water.   ____Ensure clear carbohydrate drink on the way to the hospital for bariatric patients  _x___Ensure clear carbohydrate drink 3 hours before surgery. Complete drink 1 and 1/2 hour before coming to surgery.   No gum chewing or hard candies.     __x__ 2. No Alcohol for 24 hours before or after surgery.   __x__3. No Smoking or e-cigarettes for 24 prior to surgery.  Do not use any chewable tobacco products for at least 6 hour prior to surgery   ____  4. Bring all medications with you on the day of surgery if instructed.    __x__ 5. Notify your doctor if there is any change in your medical condition     (cold, fever, infections).    x___6. On the morning of surgery brush your teeth with toothpaste and water.  You may rinse your mouth with mouth wash if you wish.  Do not swallow any toothpaste or mouthwash.   Do not wear jewelry, make-up,  hairpins, clips or nail polish.  Do not wear lotions, powders, or perfumes. You may wear deodorant.  Do not shave 48 hours prior to surgery. Men may shave face and neck.  Do not bring valuables to the hospital.    [facility] is not responsible for any belongings or valuables.               Contacts, dentures or bridgework may not be worn into surgery.  Leave your suitcase in the car. After surgery it may be brought to your room.  For patients admitted to the hospital, discharge time is determined by your                       treatment team.  _  Patients discharged the day of surgery will not be allowed to drive home.  You will need someone to drive you home and stay with you the night of your procedure.    Please read over the following fact sheets that you were given:   [facility] Preparing for Surgery and or MRSA Information   _x___ Take anti-hypertensive listed below, cardiac, seizure, asthma,     anti-reflux and psychiatric medicines. These include:  1. omeprazole (PRILOSEC) 40 MG capsule  2.  3.  4.  5.  6.  ____Fleets enema or Magnesium Citrate as directed.   _x___ Use CHG Soap or sage wipes as directed on instruction sheet   ____ Use inhalers on the day of surgery and bring to hospital day of surgery  ____ Stop Metformin and Janumet 2 days prior to surgery.    ____ Take 1/2 of usual insulin dose the night before surgery and none on the morning     surgery.   _x___ Follow recommendations from Cardiologist, Pulmonologist or PCP regarding          stopping Aspirin, Coumadin, Plavix ,Eliquis, Effient, or Pradaxa, and Pletal.  X____Stop Anti-inflammatories such as Advil, Aleve, Ibuprofen, Motrin, Naproxen, Naprosyn, Goodies powders or aspirin products. OK to take Tylenol and                          Celebrex.   _x___ Stop supplements until after surgery.  But may continue Vitamin D, Vitamin B,       and multivitamin.   ____ Bring C-Pap to the hospital.

## 2019-01-20 LAB — URINE CULTURE
Culture: NO GROWTH
Special Requests: NORMAL

## 2019-01-22 ENCOUNTER — Other Ambulatory Visit: Payer: Self-pay

## 2019-01-22 ENCOUNTER — Other Ambulatory Visit
Admission: RE | Admit: 2019-01-22 | Discharge: 2019-01-22 | Disposition: A | Discharge status: Home / Self Care | Payer: Medicare HMO | Source: Ambulatory Visit | Attending: Orthopedic Surgery | Admitting: Orthopedic Surgery

## 2019-01-22 DIAGNOSIS — Z20828 Contact with and (suspected) exposure to other viral communicable diseases: Secondary | ICD-10-CM | POA: Diagnosis not present

## 2019-01-22 DIAGNOSIS — Z01812 Encounter for preprocedural laboratory examination: Secondary | ICD-10-CM | POA: Insufficient documentation

## 2019-01-22 LAB — SARS CORONAVIRUS 2 (TAT 6-24 HRS): SARS Coronavirus 2: NEGATIVE

## 2019-01-25 MED ORDER — TRANEXAMIC ACID-NACL 1000-0.7 MG/100ML-% IV SOLN
1000.0000 mg | INTRAVENOUS | Status: DC
  Filled 2019-01-25: qty 100

## 2019-01-26 ENCOUNTER — Encounter
Admission: RE | Disposition: A | Discharge status: Home Health | Payer: Self-pay | Source: Ambulatory Visit | Attending: Orthopedic Surgery

## 2019-01-26 ENCOUNTER — Inpatient Hospital Stay: Payer: Medicare HMO

## 2019-01-26 ENCOUNTER — Inpatient Hospital Stay: Payer: Medicare HMO | Admitting: Certified Registered Nurse Anesthetist

## 2019-01-26 ENCOUNTER — Other Ambulatory Visit: Payer: Self-pay

## 2019-01-26 ENCOUNTER — Inpatient Hospital Stay
Admission: RE | Admit: 2019-01-26 | Discharge: 2019-01-28 | DRG: 470 | Disposition: A | Discharge status: Home Health | Payer: Medicare HMO | Source: Ambulatory Visit | Attending: Orthopedic Surgery | Admitting: Orthopedic Surgery

## 2019-01-26 ENCOUNTER — Encounter: Payer: Self-pay | Admitting: Orthopedic Surgery

## 2019-01-26 DIAGNOSIS — Z87891 Personal history of nicotine dependence: Secondary | ICD-10-CM | POA: Diagnosis not present

## 2019-01-26 DIAGNOSIS — E669 Obesity, unspecified: Secondary | ICD-10-CM | POA: Diagnosis present

## 2019-01-26 DIAGNOSIS — Z96659 Presence of unspecified artificial knee joint: Secondary | ICD-10-CM

## 2019-01-26 DIAGNOSIS — Z79899 Other long term (current) drug therapy: Secondary | ICD-10-CM | POA: Diagnosis not present

## 2019-01-26 DIAGNOSIS — K219 Gastro-esophageal reflux disease without esophagitis: Secondary | ICD-10-CM | POA: Diagnosis present

## 2019-01-26 DIAGNOSIS — M1712 Unilateral primary osteoarthritis, left knee: Principal | ICD-10-CM | POA: Diagnosis present

## 2019-01-26 DIAGNOSIS — Z96652 Presence of left artificial knee joint: Secondary | ICD-10-CM

## 2019-01-26 DIAGNOSIS — Z6831 Body mass index (BMI) 31.0-31.9, adult: Secondary | ICD-10-CM | POA: Diagnosis not present

## 2019-01-26 DIAGNOSIS — I10 Essential (primary) hypertension: Secondary | ICD-10-CM | POA: Diagnosis present

## 2019-01-26 HISTORY — PX: KNEE ARTHROPLASTY: SHX992

## 2019-01-26 LAB — ABO/RH: ABO/RH(D): B POS

## 2019-01-26 SURGERY — ARTHROPLASTY, KNEE, TOTAL, USING IMAGELESS COMPUTER-ASSISTED NAVIGATION
Anesthesia: Spinal | Site: Knee | Laterality: Left

## 2019-01-26 MED ORDER — LIDOCAINE HCL (PF) 2 % IJ SOLN
INTRAMUSCULAR | Status: AC
  Filled 2019-01-26: qty 10

## 2019-01-26 MED ORDER — ONDANSETRON HCL 4 MG/2ML IJ SOLN: 4.0000 mg | Freq: Once | INTRAMUSCULAR | Status: DC | PRN

## 2019-01-26 MED ORDER — ENOXAPARIN SODIUM 30 MG/0.3ML ~~LOC~~ SOLN
30.0000 mg | Freq: Two times a day (BID) | SUBCUTANEOUS | Status: DC
  Administered 2019-01-27 – 2019-01-28 (×3): 30 mg via SUBCUTANEOUS
  Filled 2019-01-26 (×3): qty 0.3

## 2019-01-26 MED ORDER — METOCLOPRAMIDE HCL 10 MG PO TABS
5.0000 mg | ORAL_TABLET | Freq: Three times a day (TID) | ORAL | Status: DC | PRN
  Administered 2019-01-26: 10 mg via ORAL

## 2019-01-26 MED ORDER — BUPIVACAINE HCL (PF) 0.25 % IJ SOLN
INTRAMUSCULAR | Status: DC | PRN
  Administered 2019-01-26: 60 mL

## 2019-01-26 MED ORDER — CEFAZOLIN SODIUM-DEXTROSE 2-4 GM/100ML-% IV SOLN
2.0000 g | INTRAVENOUS | Status: AC
  Administered 2019-01-26: 2 g via INTRAVENOUS

## 2019-01-26 MED ORDER — ONDANSETRON HCL 4 MG/2ML IJ SOLN: 4.0000 mg | Freq: Four times a day (QID) | INTRAMUSCULAR | Status: DC | PRN

## 2019-01-26 MED ORDER — MENTHOL 3 MG MT LOZG
1.0000 | LOZENGE | OROMUCOSAL | Status: DC | PRN
  Filled 2019-01-26: qty 9

## 2019-01-26 MED ORDER — OXYCODONE HCL 5 MG PO TABS
10.0000 mg | ORAL_TABLET | ORAL | Status: DC | PRN
  Administered 2019-01-26: 10 mg via ORAL
  Filled 2019-01-26: qty 2

## 2019-01-26 MED ORDER — NEOMYCIN-POLYMYXIN B GU 40-200000 IR SOLN
Status: DC | PRN
  Administered 2019-01-26: 14 mL

## 2019-01-26 MED ORDER — PANTOPRAZOLE SODIUM 40 MG PO TBEC
40.0000 mg | DELAYED_RELEASE_TABLET | Freq: Two times a day (BID) | ORAL | Status: DC
  Administered 2019-01-26 – 2019-01-28 (×5): 40 mg via ORAL
  Filled 2019-01-26 (×5): qty 1

## 2019-01-26 MED ORDER — PROPOFOL 500 MG/50ML IV EMUL
INTRAVENOUS | Status: AC
  Filled 2019-01-26: qty 50

## 2019-01-26 MED ORDER — HYDROMORPHONE HCL 1 MG/ML IJ SOLN: 0.5000 mg | INTRAMUSCULAR | Status: DC | PRN

## 2019-01-26 MED ORDER — PROPOFOL 500 MG/50ML IV EMUL
INTRAVENOUS | Status: DC | PRN
  Administered 2019-01-26: 50 ug/kg/min via INTRAVENOUS

## 2019-01-26 MED ORDER — FENTANYL CITRATE (PF) 100 MCG/2ML IJ SOLN
INTRAMUSCULAR | Status: DC | PRN
  Administered 2019-01-26 (×2): 50 ug via INTRAVENOUS

## 2019-01-26 MED ORDER — CELECOXIB 200 MG PO CAPS
400.0000 mg | ORAL_CAPSULE | Freq: Once | ORAL | Status: AC
  Administered 2019-01-26: 07:00:00 400 mg via ORAL

## 2019-01-26 MED ORDER — ONDANSETRON HCL 4 MG PO TABS: 4.0000 mg | ORAL_TABLET | Freq: Four times a day (QID) | ORAL | Status: DC | PRN

## 2019-01-26 MED ORDER — PHENYLEPHRINE HCL (PRESSORS) 10 MG/ML IV SOLN
INTRAVENOUS | Status: AC
  Filled 2019-01-26: qty 2

## 2019-01-26 MED ORDER — FLUTICASONE PROPIONATE 50 MCG/ACT NA SUSP
1.0000 | Freq: Every day | NASAL | Status: DC | PRN
  Filled 2019-01-26: qty 16

## 2019-01-26 MED ORDER — TRANEXAMIC ACID-NACL 1000-0.7 MG/100ML-% IV SOLN
1000.0000 mg | Freq: Once | INTRAVENOUS | Status: AC
  Administered 2019-01-26: 1000 mg via INTRAVENOUS
  Filled 2019-01-26: qty 100

## 2019-01-26 MED ORDER — LISINOPRIL 10 MG PO TABS
10.0000 mg | ORAL_TABLET | Freq: Every day | ORAL | Status: DC
  Administered 2019-01-27 – 2019-01-28 (×2): 10 mg via ORAL
  Filled 2019-01-26 (×2): qty 1

## 2019-01-26 MED ORDER — PROPOFOL 10 MG/ML IV BOLUS
INTRAVENOUS | Status: DC | PRN
  Administered 2019-01-26 (×2): 26 mg via INTRAVENOUS

## 2019-01-26 MED ORDER — MAGNESIUM HYDROXIDE 400 MG/5ML PO SUSP
30.0000 mL | Freq: Every day | ORAL | Status: DC
  Administered 2019-01-27: 30 mL via ORAL
  Filled 2019-01-26: qty 30

## 2019-01-26 MED ORDER — LACTATED RINGERS IV SOLN
INTRAVENOUS | Status: DC
  Administered 2019-01-26 (×2): via INTRAVENOUS

## 2019-01-26 MED ORDER — LIDOCAINE HCL (CARDIAC) PF 100 MG/5ML IV SOSY
PREFILLED_SYRINGE | INTRAVENOUS | Status: DC | PRN
  Administered 2019-01-26: 100 mg via INTRAVENOUS

## 2019-01-26 MED ORDER — OXYCODONE HCL 5 MG PO TABS
5.0000 mg | ORAL_TABLET | ORAL | Status: DC | PRN
  Administered 2019-01-26 – 2019-01-28 (×3): 5 mg via ORAL
  Filled 2019-01-26 (×3): qty 1

## 2019-01-26 MED ORDER — SODIUM CHLORIDE FLUSH 0.9 % IV SOLN
INTRAVENOUS | Status: AC
  Filled 2019-01-26: qty 40

## 2019-01-26 MED ORDER — NEOMYCIN-POLYMYXIN B GU 40-200000 IR SOLN
Status: AC
  Filled 2019-01-26: qty 20

## 2019-01-26 MED ORDER — BUPIVACAINE HCL (PF) 0.25 % IJ SOLN
INTRAMUSCULAR | Status: AC
  Filled 2019-01-26: qty 60

## 2019-01-26 MED ORDER — METOCLOPRAMIDE HCL 10 MG PO TABS
10.0000 mg | ORAL_TABLET | Freq: Three times a day (TID) | ORAL | Status: DC
  Administered 2019-01-26 – 2019-01-28 (×6): 10 mg via ORAL
  Filled 2019-01-26 (×7): qty 1

## 2019-01-26 MED ORDER — ALUM & MAG HYDROXIDE-SIMETH 200-200-20 MG/5ML PO SUSP: 30.0000 mL | ORAL | Status: DC | PRN

## 2019-01-26 MED ORDER — SODIUM CHLORIDE 0.9 % IV SOLN
INTRAVENOUS | Status: DC
  Administered 2019-01-26 – 2019-01-27 (×2): via INTRAVENOUS

## 2019-01-26 MED ORDER — FENTANYL CITRATE (PF) 100 MCG/2ML IJ SOLN
INTRAMUSCULAR | Status: AC
  Filled 2019-01-26: qty 2

## 2019-01-26 MED ORDER — CHLORHEXIDINE GLUCONATE 4 % EX LIQD: 60.0000 mL | Freq: Once | CUTANEOUS | Status: DC

## 2019-01-26 MED ORDER — MIDAZOLAM HCL 2 MG/2ML IJ SOLN
INTRAMUSCULAR | Status: AC
  Filled 2019-01-26: qty 2

## 2019-01-26 MED ORDER — TRAMADOL HCL 50 MG PO TABS: 50.0000 mg | ORAL_TABLET | ORAL | Status: DC | PRN

## 2019-01-26 MED ORDER — CEFAZOLIN SODIUM-DEXTROSE 2-4 GM/100ML-% IV SOLN
INTRAVENOUS | Status: AC
  Filled 2019-01-26: qty 100

## 2019-01-26 MED ORDER — BUPIVACAINE HCL (PF) 0.5 % IJ SOLN
INTRAMUSCULAR | Status: DC | PRN
  Administered 2019-01-26: 2.5 mL

## 2019-01-26 MED ORDER — SODIUM CHLORIDE 0.9 % IV SOLN
INTRAVENOUS | Status: DC | PRN
  Administered 2019-01-26: 60 mL

## 2019-01-26 MED ORDER — DEXAMETHASONE SODIUM PHOSPHATE 10 MG/ML IJ SOLN
INTRAMUSCULAR | Status: AC
  Administered 2019-01-26: 8 mg via INTRAVENOUS
  Filled 2019-01-26: qty 1

## 2019-01-26 MED ORDER — GABAPENTIN 300 MG PO CAPS
ORAL_CAPSULE | ORAL | Status: AC
  Administered 2019-01-26: 300 mg via ORAL
  Filled 2019-01-26: qty 1

## 2019-01-26 MED ORDER — CELECOXIB 200 MG PO CAPS
200.0000 mg | ORAL_CAPSULE | Freq: Two times a day (BID) | ORAL | Status: DC
  Administered 2019-01-26 – 2019-01-28 (×4): 200 mg via ORAL
  Filled 2019-01-26 (×4): qty 1

## 2019-01-26 MED ORDER — TRANEXAMIC ACID-NACL 1000-0.7 MG/100ML-% IV SOLN
INTRAVENOUS | Status: DC | PRN
  Administered 2019-01-26: 1000 mg via INTRAVENOUS

## 2019-01-26 MED ORDER — CEFAZOLIN SODIUM-DEXTROSE 2-4 GM/100ML-% IV SOLN
2.0000 g | Freq: Four times a day (QID) | INTRAVENOUS | Status: AC
  Administered 2019-01-26 – 2019-01-27 (×4): 2 g via INTRAVENOUS
  Filled 2019-01-26 (×5): qty 100

## 2019-01-26 MED ORDER — ACETAMINOPHEN 10 MG/ML IV SOLN
INTRAVENOUS | Status: AC
  Filled 2019-01-26: qty 100

## 2019-01-26 MED ORDER — METOCLOPRAMIDE HCL 5 MG/ML IJ SOLN: 5.0000 mg | Freq: Three times a day (TID) | INTRAMUSCULAR | Status: DC | PRN

## 2019-01-26 MED ORDER — BISACODYL 10 MG RE SUPP: 10.0000 mg | Freq: Every day | RECTAL | Status: DC | PRN

## 2019-01-26 MED ORDER — GLYCOPYRROLATE 0.2 MG/ML IJ SOLN
INTRAMUSCULAR | Status: AC
  Filled 2019-01-26: qty 1

## 2019-01-26 MED ORDER — CHLORHEXIDINE GLUCONATE CLOTH 2 % EX PADS
6.0000 | MEDICATED_PAD | Freq: Every day | CUTANEOUS | Status: DC
  Administered 2019-01-27: 6 via TOPICAL

## 2019-01-26 MED ORDER — DIPHENHYDRAMINE HCL 12.5 MG/5ML PO ELIX: 12.5000 mg | ORAL_SOLUTION | ORAL | Status: DC | PRN

## 2019-01-26 MED ORDER — TETRACAINE HCL 1 % IJ SOLN
INTRAMUSCULAR | Status: DC | PRN
  Administered 2019-01-26: 5 mg via INTRASPINAL

## 2019-01-26 MED ORDER — CELECOXIB 200 MG PO CAPS
ORAL_CAPSULE | ORAL | Status: AC
  Administered 2019-01-26: 400 mg via ORAL
  Filled 2019-01-26: qty 2

## 2019-01-26 MED ORDER — DEXAMETHASONE SODIUM PHOSPHATE 10 MG/ML IJ SOLN
8.0000 mg | Freq: Once | INTRAMUSCULAR | Status: AC
  Administered 2019-01-26: 07:00:00 8 mg via INTRAVENOUS

## 2019-01-26 MED ORDER — FLEET ENEMA 7-19 GM/118ML RE ENEM: 1.0000 | ENEMA | Freq: Once | RECTAL | Status: DC | PRN

## 2019-01-26 MED ORDER — FENTANYL CITRATE (PF) 100 MCG/2ML IJ SOLN: 25.0000 ug | INTRAMUSCULAR | Status: DC | PRN

## 2019-01-26 MED ORDER — SENNOSIDES-DOCUSATE SODIUM 8.6-50 MG PO TABS
1.0000 | ORAL_TABLET | Freq: Two times a day (BID) | ORAL | Status: DC
  Administered 2019-01-26 – 2019-01-28 (×3): 1 via ORAL
  Filled 2019-01-26 (×3): qty 1

## 2019-01-26 MED ORDER — ACETAMINOPHEN 10 MG/ML IV SOLN
1000.0000 mg | Freq: Four times a day (QID) | INTRAVENOUS | Status: AC
  Administered 2019-01-26 – 2019-01-27 (×4): 1000 mg via INTRAVENOUS
  Filled 2019-01-26 (×4): qty 100

## 2019-01-26 MED ORDER — MIDAZOLAM HCL 5 MG/5ML IJ SOLN
INTRAMUSCULAR | Status: DC | PRN
  Administered 2019-01-26: 2 mg via INTRAVENOUS

## 2019-01-26 MED ORDER — SODIUM CHLORIDE 0.9 % IV SOLN
INTRAVENOUS | Status: DC | PRN
  Administered 2019-01-26: 30 ug/min via INTRAVENOUS

## 2019-01-26 MED ORDER — BUPIVACAINE LIPOSOME 1.3 % IJ SUSP
INTRAMUSCULAR | Status: AC
  Filled 2019-01-26: qty 20

## 2019-01-26 MED ORDER — PHENOL 1.4 % MT LIQD
1.0000 | OROMUCOSAL | Status: DC | PRN
  Filled 2019-01-26: qty 177

## 2019-01-26 MED ORDER — FERROUS SULFATE 325 (65 FE) MG PO TABS
325.0000 mg | ORAL_TABLET | Freq: Two times a day (BID) | ORAL | Status: DC
  Administered 2019-01-26 – 2019-01-28 (×4): 325 mg via ORAL
  Filled 2019-01-26 (×4): qty 1

## 2019-01-26 MED ORDER — GABAPENTIN 300 MG PO CAPS
300.0000 mg | ORAL_CAPSULE | Freq: Every day | ORAL | Status: DC
  Administered 2019-01-26 – 2019-01-27 (×2): 300 mg via ORAL
  Filled 2019-01-26 (×2): qty 1

## 2019-01-26 MED ORDER — GABAPENTIN 300 MG PO CAPS
300.0000 mg | ORAL_CAPSULE | Freq: Once | ORAL | Status: AC
  Administered 2019-01-26: 07:00:00 300 mg via ORAL

## 2019-01-26 MED ORDER — ACETAMINOPHEN 10 MG/ML IV SOLN
INTRAVENOUS | Status: DC | PRN
  Administered 2019-01-26: 1000 mg via INTRAVENOUS

## 2019-01-26 MED ORDER — ACETAMINOPHEN 325 MG PO TABS: 325.0000 mg | ORAL_TABLET | Freq: Four times a day (QID) | ORAL | Status: DC | PRN

## 2019-01-26 SURGICAL SUPPLY — 77 items
ATTUNE MED DOME PAT 38 KNEE (Knees) ×1 IMPLANT
ATTUNE MED DOME PAT 38MM KNEE (Knees) ×1 IMPLANT
ATTUNE PS FEM LT SZ 5 CEM KNEE (Femur) ×2 IMPLANT
ATTUNE PSRP INSR SZ5 6 KNEE (Insert) ×1 IMPLANT
ATTUNE PSRP INSR SZ5 6MM KNEE (Insert) ×1 IMPLANT
BASE TIBIAL ROT PLAT SZ 5 KNEE (Knees) IMPLANT
BATTERY INSTRU NAVIGATION (MISCELLANEOUS) ×12 IMPLANT
BLADE CLIPPER SURG (BLADE) ×2 IMPLANT
BLADE SAW 70X12.5 (BLADE) ×3 IMPLANT
BLADE SAW 90X13X1.19 OSCILLAT (BLADE) ×3 IMPLANT
BLADE SAW 90X25X1.19 OSCILLAT (BLADE) ×3 IMPLANT
CANISTER SUCT 3000ML PPV (MISCELLANEOUS) ×3 IMPLANT
CEMENT HV SMART SET (Cement) ×6 IMPLANT
COOLER POLAR GLACIER W/PUMP (MISCELLANEOUS) ×3 IMPLANT
COVER LIGHT HANDLE STERIS (MISCELLANEOUS) ×2 IMPLANT
COVER WAND RF STERILE (DRAPES) ×3 IMPLANT
CUFF TOURN SGL QUICK 24 (TOURNIQUET CUFF) ×2
CUFF TOURN SGL QUICK 30 (TOURNIQUET CUFF)
CUFF TRNQT CYL 24X4X16.5-23 (TOURNIQUET CUFF) IMPLANT
CUFF TRNQT CYL 30X4X21-28X (TOURNIQUET CUFF) IMPLANT
DRAPE 3/4 80X56 (DRAPES) ×3 IMPLANT
DRSG DERMACEA 8X12 NADH (GAUZE/BANDAGES/DRESSINGS) ×3 IMPLANT
DRSG OPSITE POSTOP 4X14 (GAUZE/BANDAGES/DRESSINGS) ×3 IMPLANT
DRSG TEGADERM 4X4.75 (GAUZE/BANDAGES/DRESSINGS) ×3 IMPLANT
DURAPREP 26ML APPLICATOR (WOUND CARE) ×6 IMPLANT
ELECT REM PT RETURN 9FT ADLT (ELECTROSURGICAL) ×3
ELECTRODE REM PT RTRN 9FT ADLT (ELECTROSURGICAL) ×1 IMPLANT
EX-PIN ORTHOLOCK NAV 4X150 (PIN) ×6 IMPLANT
GLOVE BIO SURGEON STRL SZ7 (GLOVE) ×2 IMPLANT
GLOVE BIO SURGEON STRL SZ7.5 (GLOVE) ×6 IMPLANT
GLOVE BIOGEL M STRL SZ7.5 (GLOVE) ×6 IMPLANT
GLOVE BIOGEL PI IND STRL 7.5 (GLOVE) ×1 IMPLANT
GLOVE BIOGEL PI INDICATOR 7.5 (GLOVE) ×2
GLOVE INDICATOR 8.0 STRL GRN (GLOVE) ×3 IMPLANT
GOWN STRL REUS W/ TWL LRG LVL3 (GOWN DISPOSABLE) ×2 IMPLANT
GOWN STRL REUS W/ TWL XL LVL3 (GOWN DISPOSABLE) ×1 IMPLANT
GOWN STRL REUS W/TWL LRG LVL3 (GOWN DISPOSABLE) ×4
GOWN STRL REUS W/TWL XL LVL3 (GOWN DISPOSABLE) ×2
HEMOVAC 400CC 10FR (MISCELLANEOUS) ×3 IMPLANT
HOLDER FOLEY CATH W/STRAP (MISCELLANEOUS) ×3 IMPLANT
HOOD PEEL AWAY FLYTE STAYCOOL (MISCELLANEOUS) ×6 IMPLANT
KIT TURNOVER KIT A (KITS) ×3 IMPLANT
KNIFE SCULPS 14X20 (INSTRUMENTS) ×3 IMPLANT
LABEL OR SOLS (LABEL) ×3 IMPLANT
MANIFOLD NEPTUNE II (INSTRUMENTS) ×3 IMPLANT
NDL SAFETY ECLIPSE 18X1.5 (NEEDLE) ×1 IMPLANT
NDL SPNL 20GX3.5 QUINCKE YW (NEEDLE) ×2 IMPLANT
NEEDLE HYPO 18GX1.5 SHARP (NEEDLE) ×2
NEEDLE SPNL 20GX3.5 QUINCKE YW (NEEDLE) ×6 IMPLANT
NS IRRIG 500ML POUR BTL (IV SOLUTION) ×3 IMPLANT
PACK TOTAL KNEE (MISCELLANEOUS) ×3 IMPLANT
PAD WRAPON POLAR KNEE (MISCELLANEOUS) ×1 IMPLANT
PENCIL SMOKE ULTRAEVAC 22 CON (MISCELLANEOUS) ×3 IMPLANT
PIN DRILL QUICK PACK ×3 IMPLANT
PIN FIXATION 1/8DIA X 3INL (PIN) ×9 IMPLANT
PULSAVAC PLUS IRRIG FAN TIP (DISPOSABLE) ×3
SOL .9 NS 3000ML IRR  AL (IV SOLUTION) ×2
SOL .9 NS 3000ML IRR UROMATIC (IV SOLUTION) ×1 IMPLANT
SOL PREP PVP 2OZ (MISCELLANEOUS) ×3
SOLUTION PREP PVP 2OZ (MISCELLANEOUS) ×1 IMPLANT
SPONGE DRAIN TRACH 4X4 STRL 2S (GAUZE/BANDAGES/DRESSINGS) ×3 IMPLANT
STAPLER SKIN PROX 35W (STAPLE) ×3 IMPLANT
STOCKINETTE IMPERV 14X48 (MISCELLANEOUS) ×2 IMPLANT
STRAP TIBIA SHORT (MISCELLANEOUS) ×3 IMPLANT
SUCTION FRAZIER HANDLE 10FR (MISCELLANEOUS) ×2
SUCTION TUBE FRAZIER 10FR DISP (MISCELLANEOUS) ×1 IMPLANT
SUT VIC AB 0 CT1 36 (SUTURE) ×5 IMPLANT
SUT VIC AB 1 CT1 36 (SUTURE) ×6 IMPLANT
SUT VIC AB 2-0 CT2 27 (SUTURE) ×3 IMPLANT
SYR 20ML LL LF (SYRINGE) ×3 IMPLANT
SYR 30ML LL (SYRINGE) ×6 IMPLANT
TIBIAL BASE ROT PLAT SZ 5 KNEE (Knees) ×3 IMPLANT
TIP FAN IRRIG PULSAVAC PLUS (DISPOSABLE) ×1 IMPLANT
TOWEL OR 17X26 4PK STRL BLUE (TOWEL DISPOSABLE) ×3 IMPLANT
TOWER CARTRIDGE SMART MIX (DISPOSABLE) ×3 IMPLANT
TRAY FOLEY MTR SLVR 16FR STAT (SET/KITS/TRAYS/PACK) ×3 IMPLANT
WRAPON POLAR PAD KNEE (MISCELLANEOUS) ×3

## 2019-01-26 NOTE — Evaluation (Signed)
Physical Therapy Evaluation Patient Details Name: Craig Michael MRN: [id number] DOB: 1950/12/09 Today's Date: 01/26/2019   History of Present Illness  admitted for acute hospitalization status post L TKR (01/26/19), WBAT.  Clinical Impression  Upon evaluation, patient alert and oriented; follows commands and demonstrates good effort with mobility/therex.  L knee pain rated 4/10 (FACES); med received end of session per RN.  Some residual paresthesia and muscle weakness due to spinal, progressively resolving with time.  L hip and knee grossly 3-/5, ankle 1/5; tolerating act assist ROM to L knee approx 0-70 degrees. Increased time/effort required for isolated muscle activation; mild/mod lag with L SLR.  Additional mobility tasks deferred as result.  Will continue to assess/progress in subsequent sessions as appropriate.  RN informed/aware; to dangle at bedside this PM as spinal continues to wear off. Would benefit from skilled PT to address above deficits and promote optimal return to PLOF; Recommend transition to [place] upon discharge from acute hospitalization.     Follow Up Recommendations Home health PT    Equipment Recommendations  Rolling walker with 5" wheels;3in1 (PT)    Recommendations for Other Services       Precautions / Restrictions Precautions Precautions: Fall Restrictions Weight Bearing Restrictions: Yes LLE Weight Bearing: Weight bearing as tolerated      Mobility  Bed Mobility               General bed mobility comments: deferred due to persistent paresthesia, weakness from spinal  Transfers                 General transfer comment: deferred due to persistent paresthesia, weakness from spinal  Ambulation/Gait             General Gait Details: deferred due to persistent paresthesia, weakness from spinal  Stairs            Wheelchair Mobility    Modified Rankin (Stroke Patients Only)       Balance Overall balance assessment:  Needs assistance                                           Pertinent Vitals/Pain Pain Assessment: 0-10 Pain Score: 4  Pain Location: L knee Pain Descriptors / Indicators: Aching Pain Intervention(s): Limited activity within patient's tolerance;Monitored during session;Repositioned;RN gave pain meds during session    Home Living Family/patient expects to be discharged to:: Private residence Living Arrangements: Spouse/significant other Available Help at Discharge: Family Type of Home: House Home Access: Stairs to enter   Technical brewer of Steps: 1 Home Layout: One level Home Equipment: None      Prior Function Level of Independence: Independent               Hand Dominance        Extremity/Trunk Assessment   Upper Extremity Assessment Upper Extremity Assessment: Overall WFL for tasks assessed    Lower Extremity Assessment Lower Extremity Assessment: (bilat LEs grossly 3-/5 at hips and knees, 1/5 bilat ankles; block still in place (full sensation not yet returned))       Communication   Communication: No difficulties  Cognition Arousal/Alertness: Awake/alert Behavior During Therapy: WFL for tasks assessed/performed Overall Cognitive Status: Within Functional Limits for tasks assessed  General Comments      Exercises Total Joint Exercises Goniometric ROM: L knee: approx 0-70 degrees, act assist Other Exercises Other Exercises: Supine L LE therex, 1x10, act assist ROM: ankle pumps, quad sets, SAQs, heel slides, hip abduct/adduct and SLR.  Increased time/effort for supine therex due to continued paresthesia after spinal   Assessment/Plan    PT Assessment Patient needs continued PT services  PT Problem List Decreased strength;Decreased range of motion;Decreased activity tolerance;Decreased balance;Decreased mobility;Decreased coordination;Decreased knowledge of use of  DME;Decreased safety awareness;Decreased knowledge of precautions;Impaired sensation       PT Treatment Interventions DME instruction;Gait training;Stair training;Functional mobility training;Therapeutic activities;Therapeutic exercise;Balance training;Patient/family education    PT Goals (Current goals can be found in the Care Plan section)  Acute Rehab PT Goals Patient Stated Goal: to do what I can PT Goal Formulation: With patient Time For Goal Achievement: 02/09/19 Potential to Achieve Goals: Good    Frequency BID   Barriers to discharge        Co-evaluation               AM-PAC PT "6 Clicks" Mobility  Outcome Measure Help needed turning from your back to your side while in a flat bed without using bedrails?: A Little Help needed moving from lying on your back to sitting on the side of a flat bed without using bedrails?: A Little Help needed moving to and from a bed to a chair (including a wheelchair)?: A Little Help needed standing up from a chair using your arms (e.g., wheelchair or bedside chair)?: A Little Help needed to walk in hospital room?: A Little Help needed climbing 3-5 steps with a railing? : A Lot 6 Click Score: 17    End of Session   Activity Tolerance: Patient tolerated treatment well Patient left: in bed;with call bell/phone within reach;with bed alarm set Nurse Communication: Mobility status PT Visit Diagnosis: Muscle weakness (generalized) (M62.81);Difficulty in walking, not elsewhere classified (R26.2);Pain Pain - Right/Left: Left Pain - part of body: Knee    Time: [id number] PT Time Calculation (min) (ACUTE ONLY): 24 min   Charges:   PT Evaluation $PT Eval Moderate Complexity: 1 Mod PT Treatments $Therapeutic Exercise: 8-22 mins        Ardyn Forge H. Owens Shark, PT, DPT, NCS 01/26/19, 5:14 PM [phone]

## 2019-01-26 NOTE — Anesthesia Preprocedure Evaluation (Signed)
Anesthesia Evaluation  Patient identified by MRN, date of birth, ID band Patient awake    Reviewed: Allergy & Precautions, NPO status , Patient's Chart, lab work & pertinent test results  History of Anesthesia Complications Negative for: history of anesthetic complications  Airway Mallampati: II  TM Distance: >3 FB Neck ROM: Full    Dental no notable dental hx.    Pulmonary neg sleep apnea, neg COPD, former smoker,    breath sounds clear to auscultation- rhonchi (-) wheezing      Cardiovascular hypertension, Pt. on medications (-) CAD, (-) Past MI, (-) Cardiac Stents and (-) CABG  Rhythm:Regular Rate:Normal - Systolic murmurs and - Diastolic murmurs    Neuro/Psych neg Seizures negative neurological ROS  negative psych ROS   GI/Hepatic Neg liver ROS, GERD  ,  Endo/Other  negative endocrine ROSneg diabetes  Renal/GU negative Renal ROS     Musculoskeletal  (+) Arthritis ,   Abdominal (+) + obese,   Peds  Hematology negative hematology ROS (+)   Anesthesia Other Findings Past Medical History: No date: Arthritis No date: GERD (gastroesophageal reflux disease) No date: Hypertension   Reproductive/Obstetrics                             Lab Results  Component Value Date   WBC 6.1 01/19/2019   HGB 14.3 01/19/2019   HCT 42.4 01/19/2019   MCV 92.8 01/19/2019   PLT 206 01/19/2019    Anesthesia Physical Anesthesia Plan  ASA: II  Anesthesia Plan: Spinal   Post-op Pain Management:    Induction:   PONV Risk Score and Plan: 1 and Propofol infusion  Airway Management Planned: Natural Airway  Additional Equipment:   Intra-op Plan:   Post-operative Plan:   Informed Consent: I have reviewed the patients History and Physical, chart, labs and discussed the procedure including the risks, benefits and alternatives for the proposed anesthesia with the patient or authorized representative  who has indicated his/her understanding and acceptance.     Dental advisory given  Plan Discussed with: CRNA and Anesthesiologist  Anesthesia Plan Comments:         Anesthesia Quick Evaluation

## 2019-01-26 NOTE — H&P (Signed)
The patient has been re-examined, and the chart reviewed, and there have been no interval changes to the documented history and physical.    The risks, benefits, and alternatives have been discussed at length. The patient expressed understanding of the risks benefits and agreed with plans for surgical intervention.  Beula Joyner P. Siyah Mault, Jr. M.D.    

## 2019-01-26 NOTE — Transfer of Care (Signed)
Immediate Anesthesia Transfer of Care Note  Patient: Craig Michael  Procedure(s) Performed: COMPUTER ASSISTED TOTAL KNEE ARTHROPLASTY   RNFA (Left Knee)  Patient Location: PACU  Anesthesia Type:Spinal  Level of Consciousness: sedated  Airway & Oxygen Therapy: Patient Spontanous Breathing and Patient connected to nasal cannula oxygen  Post-op Assessment: Report given to RN and Post -op Vital signs reviewed and stable  Post vital signs: Reviewed and stable  Last Vitals:  Vitals Value Taken Time  BP 125/70 01/26/19 1119  Temp    Pulse 81 01/26/19 1119  Resp 14 01/26/19 1119  SpO2 99 % 01/26/19 1119  Vitals shown include unvalidated device data.  Last Pain:  Vitals:   01/26/19 0639  TempSrc: Tympanic  PainSc: 0-No pain         Complications: No apparent anesthesia complications

## 2019-01-26 NOTE — Anesthesia Procedure Notes (Signed)
Spinal  Patient location during procedure: OR Start time: 01/26/2019 7:23 AM End time: 01/26/2019 7:29 AM Staffing Resident/CRNA: Bernardo Heater, CRNA Performed: resident/CRNA  Preanesthetic Checklist Completed: patient identified, site marked, surgical consent, pre-op evaluation, timeout performed, IV checked, risks and benefits discussed and monitors and equipment checked Spinal Block Patient position: sitting Prep: ChloraPrep Patient monitoring: heart rate, continuous pulse ox, blood pressure and cardiac monitor Approach: midline Location: L3-4 Injection technique: single-shot Needle Needle type: Introducer and Pencan  Needle gauge: 24 G Needle length: 9 cm Additional Notes Negative paresthesia. Negative blood return. Positive free-flowing CSF. Expiration date of kit checked and confirmed. Patient tolerated procedure well, without complications.

## 2019-01-26 NOTE — Op Note (Signed)
OPERATIVE NOTE  DATE OF SURGERY:  01/26/2019  PATIENT NAME:  Craig Michael   DOB: 06-02-1950  MRN: [id number]  PRE-OPERATIVE DIAGNOSIS: Degenerative arthrosis of the left knee, primary  POST-OPERATIVE DIAGNOSIS:  Same  PROCEDURE:  Left total knee arthroplasty using computer-assisted navigation  SURGEON:  Marciano Sequin. M.D.  ASSISTANT: Cassell Smiles, PA-C (present and scrubbed throughout the case, critical for assistance with exposure, retraction, instrumentation, and closure)  ANESTHESIA: spinal  ESTIMATED BLOOD LOSS: 50 mL  FLUIDS REPLACED: 1000 mL of crystalloid  TOURNIQUET TIME: 98 minutes  DRAINS: 2 medium Hemovac drains  SOFT TISSUE RELEASES: Anterior cruciate ligament, posterior cruciate ligament, deep medial collateral ligament, patellofemoral ligament  IMPLANTS UTILIZED: DePuy Attune size 5 posterior stabilized femoral component (cemented), size 5 rotating platform tibial component (cemented), 38 mm medialized dome patella (cemented), and a 6 mm stabilized rotating platform polyethylene insert.  INDICATIONS FOR SURGERY: Craig Michael is a 68 y.o. year old male with a long history of progressive knee pain. X-rays demonstrated severe degenerative changes in tricompartmental fashion. The patient had not seen any significant improvement despite conservative nonsurgical intervention. After discussion of the risks and benefits of surgical intervention, the patient expressed understanding of the risks benefits and agree with plans for total knee arthroplasty.   The risks, benefits, and alternatives were discussed at length including but not limited to the risks of infection, bleeding, nerve injury, stiffness, blood clots, the need for revision surgery, cardiopulmonary complications, among others, and they were willing to proceed.  PROCEDURE IN DETAIL: The patient was brought into the operating room and, after adequate spinal anesthesia was achieved, a tourniquet was placed  on the patient's upper thigh. The patient's knee and leg were cleaned and prepped with alcohol and DuraPrep and draped in the usual sterile fashion. A "timeout" was performed as per usual protocol. The lower extremity was exsanguinated using an Esmarch, and the tourniquet was inflated to 300 mmHg. An anterior longitudinal incision was made followed by a standard mid vastus approach. The deep fibers of the medial collateral ligament were elevated in a subperiosteal fashion off of the medial flare of the tibia so as to maintain a continuous soft tissue sleeve. The patella was subluxed laterally and the patellofemoral ligament was incised. Inspection of the knee demonstrated severe degenerative changes with full-thickness loss of articular cartilage. Osteophytes were debrided using a rongeur. Anterior and posterior cruciate ligaments were excised. Two 4.0 mm Schanz pins were inserted in the femur and into the tibia for attachment of the array of trackers used for computer-assisted navigation. Hip center was identified using a circumduction technique. Distal landmarks were mapped using the computer. The distal femur and proximal tibia were mapped using the computer. The distal femoral cutting guide was positioned using computer-assisted navigation so as to achieve a 5 distal valgus cut. The femur was sized and it was felt that a size 5 femoral component was appropriate. A size 5 femoral cutting guide was positioned and the anterior cut was performed and verified using the computer. This was followed by completion of the posterior and chamfer cuts. Femoral cutting guide for the central box was then positioned in the center box cut was performed.  Attention was then directed to the proximal tibia. Medial and lateral menisci were excised. The extramedullary tibial cutting guide was positioned using computer-assisted navigation so as to achieve a 0 varus-valgus alignment and 3 posterior slope. The cut was performed and  verified using the computer. The proximal tibia was  sized and it was felt that a size 5 tibial tray was appropriate. Tibial and femoral trials were inserted followed by insertion of a 6 mm polyethylene insert. This allowed for excellent mediolateral soft tissue balancing both in flexion and in full extension. Finally, the patella was cut and prepared so as to accommodate a 38 mm medialized dome patella. A patella trial was placed and the knee was placed through a range of motion with excellent patellar tracking appreciated. The femoral trial was removed after debridement of posterior osteophytes. The central post-hole for the tibial component was reamed followed by insertion of a keel punch. Tibial trials were then removed. Cut surfaces of bone were irrigated with copious amounts of normal saline with antibiotic solution using pulsatile lavage and then suctioned dry. Polymethylmethacrylate cement was prepared in the usual fashion using a vacuum mixer. Cement was applied to the cut surface of the proximal tibia as well as along the undersurface of a size 5 rotating platform tibial component. Tibial component was positioned and impacted into place. Excess cement was removed using [employer] fast streamer. Cement was then applied to the cut surfaces of the femur as well as along the posterior flanges of the size 5 femoral component. The femoral component was positioned and impacted into place. Excess cement was removed using [employer] fast streamer. A 6 mm polyethylene trial was inserted and the knee was brought into full extension with steady axial compression applied. Finally, cement was applied to the backside of a 38 mm medialized dome patella and the patellar component was positioned and patellar clamp applied. Excess cement was removed using [employer] fast streamer. After adequate curing of the cement, the tourniquet was deflated after a total tourniquet time of 98 minutes. Hemostasis was achieved using electrocautery. The knee was  irrigated with copious amounts of normal saline with antibiotic solution using pulsatile lavage and then suctioned dry. 20 mL of 1.3% Exparel and 60 mL of 0.25% Marcaine in 40 mL of normal saline was injected along the posterior capsule, medial and lateral gutters, and along the arthrotomy site. A 6 mm stabilized rotating platform polyethylene insert was inserted and the knee was placed through a range of motion with excellent mediolateral soft tissue balancing appreciated and excellent patellar tracking noted. 2 medium drains were placed in the wound bed and brought out through separate stab incisions. The medial parapatellar portion of the incision was reapproximated using interrupted sutures of #1 Vicryl. Subcutaneous tissue was approximated in layers using first #0 Vicryl followed #2-0 Vicryl. The skin was approximated with skin staples. A sterile dressing was applied.  The patient tolerated the procedure well and was transported to the recovery room in stable condition.     P. Holley Bouche., M.D.

## 2019-01-26 NOTE — Anesthesia Post-op Follow-up Note (Signed)
Anesthesia QCDR form completed.        

## 2019-01-27 ENCOUNTER — Encounter: Payer: Self-pay | Admitting: Orthopedic Surgery

## 2019-01-27 NOTE — Evaluation (Signed)
Occupational Therapy Evaluation Patient Details Name: Craig Michael MRN: [id number] DOB: 05-Dec-1950 Today's Date: 01/27/2019    History of Present Illness admitted for acute hospitalization status post L TKR (01/26/19), WBAT.   Clinical Impression   Pt seen for OT evaluation this date, POD#1 from above surgery. Pt was independent in all ADL and functional mobility prior to surgery and he is eager to return to PLOF with less pain and improved safety and independence. Pt currently requires PRN minimal assist for LB dressing while in seated position due to pain and limited AROM of L knee. Pt instructed in polar care mgt, pet care considerations, RW mgt/functional transfer training, falls prevention strategies, home/routines modifications, DME/AE for LB bathing and dressing tasks, and compression stocking mgt. Handout provided. Pt verbalized understanding of all instruction provided. Pt able to perform bed mobility with modified independence and required CGA for functional transfers and ambulating with RW to recliner from EOB. Pt reported L knee pain 0/10 at rest increasing to 3-4/10 during functional mobility. Do not currently anticipate any further skilled OT needs at this time. Will sign off. Please re-consult if additional OT needs arise.     Follow Up Recommendations  No OT follow up    Equipment Recommendations  3 in 1 bedside commode;Other (comment)(reacher)    Recommendations for Other Services       Precautions / Restrictions Precautions Precautions: Fall;Knee Required Braces or Orthoses: (no KI required, indep with SLR) Restrictions Weight Bearing Restrictions: Yes LLE Weight Bearing: Weight bearing as tolerated      Mobility Bed Mobility Overal bed mobility: Modified Independent             General bed mobility comments: no difficulty noted  Transfers Overall transfer level: Needs assistance Equipment used: Rolling walker (2 wheeled) Transfers: Sit to/from  Stand Sit to Stand: Min guard         General transfer comment: initial cues for hand placement and scooting EOB further    Balance Overall balance assessment: Needs assistance Sitting-balance support: Feet supported;No upper extremity supported Sitting balance-Leahy Scale: Good     Standing balance support: Bilateral upper extremity supported Standing balance-Leahy Scale: Fair                             ADL either performed or assessed with clinical judgement   ADL                                         General ADL Comments: PRN Min A for LB ADL, CGA for functional ADL transfers     Vision Baseline Vision/History: Wears glasses Wears Glasses: At all times Patient Visual Report: No change from baseline Vision Assessment?: No apparent visual deficits     Perception     Praxis      Pertinent Vitals/Pain Pain Assessment: 0-10 Pain Score: 4  Pain Location: L knee Pain Descriptors / Indicators: Aching Pain Intervention(s): Limited activity within patient's tolerance;Monitored during session;Repositioned;Ice applied     Hand Dominance Right   Extremity/Trunk Assessment Upper Extremity Assessment Upper Extremity Assessment: Overall WFL for tasks assessed   Lower Extremity Assessment Lower Extremity Assessment: LLE deficits/detail LLE Deficits / Details: expected post-op strength/ROM deficits LLE Sensation: WNL       Communication Communication Communication: No difficulties   Cognition Arousal/Alertness: Awake/alert Behavior During Therapy: WFL for tasks  assessed/performed Overall Cognitive Status: Within Functional Limits for tasks assessed                                     General Comments       Exercises Other Exercises Other Exercises: Pt instructed in falls prevention strategies, functional transfer training, RW mgt, pet care considerations, AE/DME, polar care mgt, and compression stocking mgt; handout  provided   Shoulder Instructions      Home Living Family/patient expects to be discharged to:: Private residence Living Arrangements: Spouse/significant other Available Help at Discharge: Family Type of Home: House Home Access: Stairs to enter Technical brewer of Steps: 1   Home Layout: One level     Bathroom Shower/Tub: Teacher, early years/pre: Standard     Home Equipment: Grab bars - tub/shower          Prior Functioning/Environment Level of Independence: Independent        Comments: Pt indep with ADL, mobility, works some doing Diplomatic Services operational officer        OT Problem List: Decreased strength;Pain;Decreased range of motion;Impaired balance (sitting and/or standing)      OT Treatment/Interventions:      OT Goals(Current goals can be found in the care plan section) Acute Rehab OT Goals Patient Stated Goal: go home OT Goal Formulation: All assessment and education complete, DC therapy  OT Frequency:     Barriers to D/C:            Co-evaluation              AM-PAC OT "6 Clicks" Daily Activity     Outcome Measure Help from another person eating meals?: None Help from another person taking care of personal grooming?: None Help from another person toileting, which includes using toliet, bedpan, or urinal?: A Little Help from another person bathing (including washing, rinsing, drying)?: A Little Help from another person to put on and taking off regular upper body clothing?: None Help from another person to put on and taking off regular lower body clothing?: A Little 6 Click Score: 21   End of Session Equipment Utilized During Treatment: Gait belt;Rolling walker  Activity Tolerance: Patient tolerated treatment well Patient left: in chair;with call bell/phone within reach;with chair alarm set;Other (comment)(polar care in place, rolled towel under L ankle)  OT Visit Diagnosis: Other abnormalities of gait and mobility  (R26.89);Pain Pain - Right/Left: Left Pain - part of body: Knee                Time: [id number] OT Time Calculation (min): 24 min Charges:  OT General Charges $OT Visit: 1 Visit OT Evaluation $OT Eval Low Complexity: 1 Low OT Treatments $Self Care/Home Management : 8-22 mins  Jeni Salles, MPH, MS, OTR/L ascom [phone] 01/27/19, 9:19 AM

## 2019-01-27 NOTE — TOC Initial Note (Signed)
Transition of Care [facility]) - Initial/Assessment Note    Patient Details  Name: Craig Michael MRN: [id number] Date of Birth: 11/25/1950  Transition of [facility]) CM/SW Contact:    Su Hilt, RN Phone Number: 01/27/2019, 11:50 AM  Clinical Narrative:                 Met with the patient and his wife in the room, He lives at home with his spouse He does not have DME at home He will need a RW, I notified Brad with [employer] He does not feel he needs a BSC or 3 in 1 I have requested the price of Lovenox and let him know I will notify him once that price os obtained [facility] is not INN per Helene Kelp, I notified [facility] to inquire if they would be able to accept the patient Awaiting answer form [place] is provided by the patient's wife He can afford his medications  He is up to date with his PCP No additional needs at this time   Expected Discharge Plan: [place] Barriers to Discharge: Continued Medical Work up   Patient Goals and CMS Choice Patient states their goals for this hospitalization and ongoing recovery are:: go home CMS Medicare.gov Compare Post Acute Care list provided to:: Patient Choice offered to / list presented to : Patient  Expected Discharge Plan and Services Expected Discharge Plan: [place]   Discharge Planning Services: CM Consult Post Acute Care Choice: [place] arrangements for the past 2 months: Single Family Home                 DME Arranged: Walker rolling DME Agency: [employer] Date DME Agency Contacted: 01/27/19 Time DME Agency Contacted: 0321 Representative spoke with at DME Agency: [place]: PT [place]: [place] ([place]) Date [place]: 01/27/19 Time [place]: 25 Representative spoke with at [place]: Corene Cornea  Prior Living Arrangements/Services Living arrangements for the past 2 months: [place] Lives with:: Spouse Patient  language and need for interpreter reviewed:: Yes Do you feel safe going back to the place where you live?: Yes      Need for Family Participation in Patient Care: No (Comment) Care giver support system in place?: Yes (comment)   Criminal Activity/Legal Involvement Pertinent to Current Situation/Hospitalization: No - Comment as needed  Activities of Daily Living Home Assistive Devices/Equipment: Eyeglasses ADL Screening (condition at time of admission) Patient's cognitive ability adequate to safely complete daily activities?: Yes Is the patient deaf or have difficulty hearing?: No Does the patient have difficulty seeing, even when wearing glasses/contacts?: No Does the patient have difficulty concentrating, remembering, or making decisions?: No Patient able to express need for assistance with ADLs?: Yes Does the patient have difficulty dressing or bathing?: No Independently performs ADLs?: Yes (appropriate for developmental age) Does the patient have difficulty walking or climbing stairs?: No Weakness of Legs: Both Weakness of Arms/Hands: None  Permission Sought/Granted   Permission granted to share information with : Yes, Verbal Permission Granted              Emotional Assessment Appearance:: Appears stated age Attitude/Demeanor/Rapport: Engaged Affect (typically observed): Accepting, Pleasant, Happy, Appropriate, Calm Orientation: : Oriented to Self, Oriented to Place, Oriented to  Time, Oriented to Situation, Fluctuating Orientation (Suspected and/or reported Sundowners) Alcohol / Substance Use: Not Applicable Psych Involvement: No (comment)  Admission diagnosis:  PRIMARY OSTEOARTHRITIS OFLEFT KNEE Patient Active  Problem List   Diagnosis Date Noted  . Total knee replacement status 01/26/2019  . Primary osteoarthritis of right knee 04/02/2018  . Acute gout involving toe of right foot 11/14/2016  . Impaired glucose tolerance 11/14/2016  . Essential hypertension 02/15/2016   . Gastroesophageal reflux disease without esophagitis 09/09/2015   PCP:  Sallee Lange, NP Pharmacy:   [id number] [id number]: [phone]: [phone]    Social Determinants of Health (SDOH) Interventions    Readmission Risk Interventions No flowsheet data found.

## 2019-01-27 NOTE — Progress Notes (Signed)
Physical Therapy Treatment Patient Details Name: Craig Michael MRN: [id number] DOB: [birth year] Today's Date: 01/27/2019    History of Present Illness admitted for acute hospitalization status post L TKR (01/26/19), WBAT.    PT Comments    Completes stairs with bilat and single (R ascending) handrail, cga/close sup.  Good sequencing and good L knee control in modified SLS.   Continuing to emphasize L LE/knee mechanics throughout gait cycle; encouraged for progression towards symmetrical LE placement with sit/stand (for increased active use/WBing L LE).  Patient voices understanding, to integrate as appropriate. Patient comfortable with progress and looking forward to discharge next date.     Follow Up Recommendations  Home health PT     Equipment Recommendations  Rolling walker with 5" wheels;3in1 (PT)    Recommendations for Other Services       Precautions / Restrictions Precautions Precautions: Fall;Knee Restrictions Weight Bearing Restrictions: Yes LLE Weight Bearing: Weight bearing as tolerated    Mobility  Bed Mobility               General bed mobility comments: seated in recliner beginning/end of treatment session  Transfers Overall transfer level: Needs assistance Equipment used: Rolling walker (2 wheeled) Transfers: Sit to/from Stand Sit to Stand: Supervision         General transfer comment: encouraged for progression towards symmetrical foot placement (increased active use/WBing L LE) as pain allows  Ambulation/Gait Ambulation/Gait assistance: Supervision Gait Distance (Feet): 220 Feet Assistive device: Rolling walker (2 wheeled)       General Gait Details: maintains slightly guarded L knee posturing, min cuing for heel strike/toe off/knee flexion throughout gait cycle   Stairs Stairs: Yes Stairs assistance: Min guard   Number of Stairs: (4x2) General stair comments: up/down 4 with bilat rails, cga; up/down 4 with bilat UEs on single  rail (R ascending), cga.  Min cuing for technique; fair/good L knee control and stability in modified SLS   Wheelchair Mobility    Modified Rankin (Stroke Patients Only)       Balance Overall balance assessment: Needs assistance Sitting-balance support: Feet supported;No upper extremity supported Sitting balance-Leahy Scale: Good     Standing balance support: Bilateral upper extremity supported Standing balance-Leahy Scale: Good                              Cognition Arousal/Alertness: Awake/alert Behavior During Therapy: WFL for tasks assessed/performed Overall Cognitive Status: Within Functional Limits for tasks assessed                                        Exercises Other Exercises Other Exercises: Verbally reviewed car transfer technique; patient voiced understanding. Other Exercises: Issued handout with written/pictorial descriptions of supine therex for use as HEP; patient voiced understanding.    General Comments        Pertinent Vitals/Pain Pain Assessment: 0-10 Pain Score: 4  Pain Location: L knee Pain Descriptors / Indicators: Aching Pain Intervention(s): Limited activity within patient's tolerance;Monitored during session;Repositioned    Home Living                      Prior Function            PT Goals (current goals can now be found in the care plan section) Acute Rehab PT Goals Patient Stated Goal: go home  PT Goal Formulation: With patient Time For Goal Achievement: 02/09/19 Potential to Achieve Goals: Good Progress towards PT goals: Progressing toward goals    Frequency    BID      PT Plan Current plan remains appropriate    Co-evaluation              AM-PAC PT "6 Clicks" Mobility   Outcome Measure  Help needed turning from your back to your side while in a flat bed without using bedrails?: None Help needed moving from lying on your back to sitting on the side of a flat bed without  using bedrails?: None Help needed moving to and from a bed to a chair (including a wheelchair)?: None Help needed standing up from a chair using your arms (e.g., wheelchair or bedside chair)?: None Help needed to walk in hospital room?: None Help needed climbing 3-5 steps with a railing? : A Little 6 Click Score: 23    End of Session Equipment Utilized During Treatment: Gait belt Activity Tolerance: Patient tolerated treatment well Patient left: in chair;with call bell/phone within reach;with chair alarm set Nurse Communication: Mobility status PT Visit Diagnosis: Muscle weakness (generalized) (M62.81);Difficulty in walking, not elsewhere classified (R26.2);Pain Pain - Right/Left: Left Pain - part of body: Knee     Time: 1400-1425 PT Time Calculation (min) (ACUTE ONLY): 25 min  Charges:  $Gait Training: 8-22 mins $Therapeutic Activity: 8-22 mins                     Romon Devereux H. Owens Shark, PT, DPT, NCS 01/27/19, 2:39 PM [phone]

## 2019-01-27 NOTE — Progress Notes (Signed)
Physical Therapy Treatment Patient Details Name: Craig Michael MRN: [id number] DOB: 1950-08-06 Today's Date: 01/27/2019    History of Present Illness admitted for acute hospitalization status post L TKR (01/26/19), WBAT.    PT Comments    Patient with full sensory return, full motor control this date.  Significant improvement in LE control, allowing for safe/appropriate initiation of gait.  Completes full lap around nursing station (220') with RW, cga; min cuing for L LE heel strike/toe off, L stance phase and overall postural extension.  Good efforts to integrate cuing/education throughout session.  Cadence/gait speed slow, but steady, appropriate for household ambulation. Will plan to initiate stair training this PM.    Follow Up Recommendations  Home health PT     Equipment Recommendations  Rolling walker with 5" wheels;3in1 (PT)    Recommendations for Other Services       Precautions / Restrictions Precautions Precautions: Fall;Knee Required Braces or Orthoses: (no KI required, indep with SLR) Restrictions Weight Bearing Restrictions: Yes LLE Weight Bearing: Weight bearing as tolerated    Mobility  Bed Mobility Overal bed mobility: Modified Independent             General bed mobility comments: seated in recliner beginning/end of treatment session  Transfers Overall transfer level: Needs assistance Equipment used: Rolling walker (2 wheeled) Transfers: Sit to/from Stand Sit to Stand: Supervision;Min guard         General transfer comment: good carry-over of hand placement, maintains L LE anterior to BOS for pain control  Ambulation/Gait Ambulation/Gait assistance: Min guard Gait Distance (Feet): 220 Feet Assistive device: Rolling walker (2 wheeled)   Gait velocity: 10' walk time, 9-10 seconds   General Gait Details: step to progressing to step through gait pattern; min cuing for L LE heel strike/toe off, loading/stance phase and postural extension  throughout gait cycle. Slow, but steady, cadence; good L knee control.   Stairs             Wheelchair Mobility    Modified Rankin (Stroke Patients Only)       Balance Overall balance assessment: Needs assistance Sitting-balance support: No upper extremity supported;Feet supported Sitting balance-Leahy Scale: Good     Standing balance support: Bilateral upper extremity supported Standing balance-Leahy Scale: Fair                              Cognition Arousal/Alertness: Awake/alert Behavior During Therapy: WFL for tasks assessed/performed Overall Cognitive Status: Within Functional Limits for tasks assessed                                        Exercises Total Joint Exercises Ankle Circles/Pumps: PROM Goniometric ROM: L knee: 2-90 degrees, act assist Other Exercises Other Exercises: Seated L LE therex, 1x10, act ROM: ankle pumps, LAQs with emphasis on knee flexion, marching.  Good isolated strength and control, excellent quad activation.  Full return of sensation, motor control noted. Other Exercises: Pt instructed in falls prevention strategies, functional transfer training, RW mgt, pet care considerations, AE/DME, polar care mgt, and compression stocking mgt; handout provided    General Comments        Pertinent Vitals/Pain Pain Assessment: 0-10 Pain Score: 2  Pain Location: L knee Pain Descriptors / Indicators: Aching Pain Intervention(s): Limited activity within patient's tolerance;Monitored during session;Repositioned    Home Living Family/patient expects to  be discharged to:: Private residence Living Arrangements: Spouse/significant other Available Help at Discharge: Family Type of Home: House Home Access: Stairs to enter   Home Layout: One level Home Equipment: Grab bars - tub/shower      Prior Function Level of Independence: Independent      Comments: Pt indep with ADL, mobility, works some doing IT sales professional   PT Goals (current goals can now be found in the care plan section) Acute Rehab PT Goals Patient Stated Goal: go home PT Goal Formulation: With patient Time For Goal Achievement: 02/09/19 Potential to Achieve Goals: Good Progress towards PT goals: Progressing toward goals    Frequency    BID      PT Plan Current plan remains appropriate    Co-evaluation              AM-PAC PT "6 Clicks" Mobility   Outcome Measure  Help needed turning from your back to your side while in a flat bed without using bedrails?: None Help needed moving from lying on your back to sitting on the side of a flat bed without using bedrails?: None Help needed moving to and from a bed to a chair (including a wheelchair)?: A Little Help needed standing up from a chair using your arms (e.g., wheelchair or bedside chair)?: A Little Help needed to walk in hospital room?: A Little Help needed climbing 3-5 steps with a railing? : A Little 6 Click Score: 20    End of Session Equipment Utilized During Treatment: Gait belt Activity Tolerance: Patient tolerated treatment well Patient left: in chair;with call bell/phone within reach;with chair alarm set Nurse Communication: Mobility status PT Visit Diagnosis: Muscle weakness (generalized) (M62.81);Difficulty in walking, not elsewhere classified (R26.2);Pain Pain - Right/Left: Left Pain - part of body: Knee     Time: [id number] PT Time Calculation (min) (ACUTE ONLY): 23 min  Charges:  $Gait Training: 8-22 mins $Therapeutic Exercise: 8-22 mins                     Craig Michael, PT, DPT, NCS 01/27/19, 10:28 AM [phone]

## 2019-01-27 NOTE — TOC Progression Note (Signed)
Transition of Care [facility]) - Progression Note    Patient Details  Name: Craig Michael MRN: [id number] Date of Birth: 1950-04-30  Transition of Care [facility]) CM/SW [place], RN Phone Number: 01/27/2019, 2:07 PM  Clinical Narrative:     [facility] to [facility] the patient Sent to [facility], awaiting to see if they can accept  Expected Discharge Plan: [place] Barriers to Discharge: Continued Medical Work up  Expected Discharge Plan and Services Expected Discharge Plan: [place]   Discharge Planning Services: CM Consult Post Acute Care Choice: [place] arrangements for the past 2 months: Single Family Home                 DME Arranged: Walker rolling DME Agency: [employer] Date DME Agency Contacted: 01/27/19 Time DME Agency Contacted: 42 Representative spoke with at DME Agency: [place]: PT [place]: [place] ([place]) Date [place]: 01/27/19 Time [place]: 1149 Representative spoke with at [place]: [place] ([place]) Interventions    Readmission Risk Interventions No flowsheet data found.

## 2019-01-27 NOTE — TOC Progression Note (Signed)
Transition of Care [facility]) - Progression Note    Patient Details  Name: Craig Michael MRN: [id number] Date of Birth: 1950-12-22  Transition of Care [facility]) CM/SW [place], RN Phone Number: 01/27/2019, 3:18 PM  Clinical Narrative:    Marye Round with [facility] called and accepted the patient   Expected Discharge Plan: [place] Barriers to Discharge: Continued Medical Work up  Expected Discharge Plan and Services Expected Discharge Plan: [place]   Discharge Planning Services: CM Consult Post Acute Care Choice: [place] arrangements for the past 2 months: Single Family Home                 DME Arranged: Walker rolling DME Agency: [employer] Date DME Agency Contacted: 01/27/19 Time DME Agency Contacted: [phone] Representative spoke with at DME Agency: [place]: PT [place]: [place] ([place]) Date [place]: 01/27/19 Time [place]: 1149 Representative spoke with at [place]: [place] ([place]) Interventions    Readmission Risk Interventions No flowsheet data found.

## 2019-01-27 NOTE — Progress Notes (Signed)
D: Pt alert and oriented. Pt denies experiencing any pain at this time. Pt has been OOB with PT. New IV assess established after PT pulled out IV. IV is saline locked. Pt has voided and has had a BM today.  A: Scheduled medications administered to pt, per MD orders. Support and encouragement provided. Frequent verbal contact made.   R: No adverse drug reactions noted. Pt complaint with medications and treatment plan. Pt interacts well with staff on the unit. Pt remains safe at this time. Will continue to monitor and provide care per orders.

## 2019-01-27 NOTE — Progress Notes (Addendum)
  Subjective: 1 Day Post-Op Procedure(s) (LRB): COMPUTER ASSISTED TOTAL KNEE ARTHROPLASTY   RNFA (Left)  Patient is well, and has had no acute complaints or problems Plan is to go Home after hospital stay. Negative for chest pain and shortness of breath Fever: no Gastrointestinal:Negative for nausea and vomiting  Objective: Vital signs in last 24 hours: Temp:  [97.2 F (36.2 C)-98.5 F (36.9 C)] 98.5 F (36.9 C) (10/06 0435) Pulse Rate:  [67-100] 67 (10/06 0435) Resp:  [0-25] 18 (10/06 0435) BP: (100-150)/(70-98) 149/86 (10/06 0435) SpO2:  [97 %-100 %] 100 % (10/06 0435)  Intake/Output from previous day:  Intake/Output Summary (Last 24 hours) at 01/27/2019 0729 Last data filed at 01/27/2019 0556 Gross per 24 hour  Intake 2669.37 ml  Output 2900 ml  Net -230.63 ml    Intake/Output this shift: No intake/output data recorded.  Labs: No results for input(s): HGB in the last 72 hours. No results for input(s): WBC, RBC, HCT, PLT in the last 72 hours. No results for input(s): NA, K, CL, CO2, BUN, CREATININE, GLUCOSE, CALCIUM in the last 72 hours. No results for input(s): LABPT, INR in the last 72 hours.   EXAM General - Patient is Alert, Appropriate and Oriented Extremity - Neurovascular intact Sensation intact distally Dorsiflexion/Plantar flexion intact Incision: Post surgical dressing and Polar Care remains in place. Motor Function - intact, moving foot and toes well on exam.  Able to perform an independent straight leg raise. Cardiovascular-regular rate and rhythm, no murmurs/rubs/gallops Respiratory-lungs clear to auscultation bilaterally   Assessment/Plan: 1 Day Post-Op Procedure(s) (LRB): COMPUTER ASSISTED TOTAL KNEE ARTHROPLASTY   RNFA (Left) Active Problems:   Total knee replacement status  Estimated body mass index is 31 kg/m as calculated from the following:   Height as of this encounter: 5' 6.5" (1.689 m).   Weight as of this encounter: 88.5 kg. Advance  diet Up with therapy Plan for discharge tomorrow  DVT Prophylaxis -continue Lovenox, foot pumps, TED hose Weight-Bearing as tolerated to left leg  Cassell Smiles, PA-C [facility] 01/27/2019, 7:29 AM

## 2019-01-27 NOTE — Significant Event (Signed)
Pt dangled at 2200 and tolerated well .

## 2019-01-27 NOTE — Anesthesia Postprocedure Evaluation (Signed)
Anesthesia Post Note  Patient: Alaki Rosel  Procedure(s) Performed: COMPUTER ASSISTED TOTAL KNEE ARTHROPLASTY   RNFA (Left Knee)  Patient location during evaluation: Nursing Unit Anesthesia Type: Spinal Level of consciousness: awake, oriented and awake and alert Pain management: pain level controlled Vital Signs Assessment: post-procedure vital signs reviewed and stable Respiratory status: spontaneous breathing, nonlabored ventilation and respiratory function stable Cardiovascular status: blood pressure returned to baseline and stable Postop Assessment: no headache and no backache Anesthetic complications: no     Last Vitals:  Vitals:   01/26/19 2320 01/27/19 0435  BP: (!) 150/86 (!) 149/86  Pulse: 78 67  Resp:  18  Temp:  36.9 C  SpO2: 98% 100%    Last Pain:  Vitals:   01/27/19 0435  TempSrc: Oral  PainSc: 0-No pain                 Johnna Acosta

## 2019-01-27 NOTE — TOC Progression Note (Signed)
Transition of Care [facility]) - Progression Note    Patient Details  Name: Craig Michael MRN: [id number] Date of Birth: July 27, 1950  Transition of Care [facility]) CM/SW [place], RN Phone Number: 01/27/2019, 11:27 AM  Clinical Narrative:    Requested the price of lovenox        Expected Discharge Plan and Services                                                 Social Determinants of Health (SDOH) Interventions    Readmission Risk Interventions No flowsheet data found.

## 2019-01-27 NOTE — TOC Progression Note (Signed)
Transition of Care [facility]) - Progression Note    Patient Details  Name: Craig Michael MRN: [id number] Date of Birth: 16-Feb-1951  Transition of Care [facility]) CM/SW [place], RN Phone Number: 01/27/2019, 3:09 PM  Clinical Narrative:     Tommi Rumps with [facility] declined the patient stating they do not have the bandwidth to accept this patient I [facility] [place] [facility] the patient  Expected Discharge Plan: [place] Barriers to Discharge: Continued Medical Work up  Expected Discharge Plan and Services Expected Discharge Plan: [place]   Discharge Planning Services: CM Consult Post Acute Care Choice: [place] arrangements for the past 2 months: Single Family Home                 DME Arranged: Walker rolling DME Agency: [employer] Date DME Agency Contacted: 01/27/19 Time DME Agency Contacted: [phone] Representative spoke with at DME Agency: [place]: PT [place]: [place] ([place]) Date [place]: 01/27/19 Time [place]: 1149 Representative spoke with at [place]: [place] ([place]) Interventions    Readmission Risk Interventions No flowsheet data found.

## 2019-01-27 NOTE — TOC Benefit Eligibility Note (Signed)
Transition of Care [facility]) Benefit Eligibility Note    Patient Details  Name: Craig Michael MRN: [id number] Date of Birth: [birth year]   Medication/Dose: Enoxaparin 40mg  once daily for 14 days  Covered?: Yes  Prescription Coverage [facility]  Spoke with Person/Company/Phone Number:: Avel Peace with [employer] at [phone]  Co-Pay: $66.80 estimated copay  Prior Approval: No  Deductible: Unmet   Dannette Barbara Phone Number: [phone] or [phone] 01/27/2019, 2:01 PM

## 2019-01-28 MED ORDER — CELECOXIB 200 MG PO CAPS: 200.0000 mg | ORAL_CAPSULE | Freq: Two times a day (BID) | ORAL | 0 refills | Status: DC

## 2019-01-28 MED ORDER — ENOXAPARIN SODIUM 40 MG/0.4ML ~~LOC~~ SOLN: 40.0000 mg | SUBCUTANEOUS | 0 refills | Status: DC

## 2019-01-28 MED ORDER — OXYCODONE HCL 5 MG PO TABS: 5.0000 mg | ORAL_TABLET | ORAL | 0 refills | Status: DC | PRN

## 2019-01-28 MED ORDER — CELECOXIB 200 MG PO CAPS: 200.0000 mg | ORAL_CAPSULE | Freq: Two times a day (BID) | ORAL | 0 refills | Status: AC

## 2019-01-28 NOTE — Progress Notes (Signed)
Changed dressing per order, applied thigh ted to left leg.

## 2019-01-28 NOTE — Progress Notes (Signed)
Lovenox education given to pt and spouse, provided kit for home use

## 2019-01-28 NOTE — Discharge Summary (Signed)
Physician Discharge Summary  Patient ID: Craig Michael MRN: [id number] DOB/AGE: [birth year] 66 y.o.  Admit date: 01/26/2019 Discharge date: 01/28/2019  Admission Diagnoses:  PRIMARY OSTEOARTHRITIS OFLEFT KNEE  Surgeries:Procedure(s): COMPUTER ASSISTED TOTAL KNEE ARTHROPLASTY   RNFA on 01/26/2019  Discharge Diagnoses: Patient Active Problem List   Diagnosis Date Noted  . Total knee replacement status 01/26/2019  . Primary osteoarthritis of right knee 04/02/2018  . Acute gout involving toe of right foot 11/14/2016  . Impaired glucose tolerance 11/14/2016  . Essential hypertension 02/15/2016  . Gastroesophageal reflux disease without esophagitis 09/09/2015    Past Medical History:  Diagnosis Date  . Arthritis   . GERD (gastroesophageal reflux disease)   . Hypertension      Transfusion: n/a   Consultants (if any): n/a  Discharged Condition: Improved  Hospital Course: Craig Michael is an 68 y.o. male who was admitted 01/26/2019 with a diagnosis of left knee osteoarthritis and went to the operating room on 01/26/2019 and underwent left total knee arthroplasty. The patient received perioperative antibiotics for prophylaxis (see below). The patient tolerated the procedure well and was transported to PACU in stable condition. After meeting PACU criteria, the patient was subsequently transferred to the Orthopaedics/Rehabilitation unit.   The patient received DVT prophylaxis in the form of early mobilization, Lovenox. A sacral pad had been placed and heels were elevated off of the bed with rolled towels in order to protect skin integrity. Foley catheter was discontinued on postoperative day #1. Wound drains were discontinued on postoperative day #2. The surgical incision was healing well without signs of infection.  Physical therapy was initiated postoperatively for transfers, gait training, and strengthening. Occupational therapy was initiated for activities of daily living and  evaluation for assisted devices. Rehabilitation goals were reviewed in detail with the patient. The patient made steady progress with physical therapy and physical therapy recommended discharge to Home.   The patient achieved his preliminary goals of this hospitalization and was felt to be medically and orthopaedically appropriate for discharge.  He was given perioperative antibiotics:  Anti-infectives (From admission, onward)   Start     Dose/Rate Route Frequency Ordered Stop   01/26/19 1500  ceFAZolin (ANCEF) IVPB 2g/100 mL premix     2 g 200 mL/hr over 30 Minutes Intravenous Every 6 hours 01/26/19 1451 01/27/19 1029   01/26/19 0645  ceFAZolin (ANCEF) IVPB 2g/100 mL premix     2 g 200 mL/hr over 30 Minutes Intravenous On call to O.R. 01/26/19 [id number] 01/26/19 0738   01/26/19 0620  ceFAZolin (ANCEF) 2-4 GM/100ML-% IVPB    Note to Pharmacy: [place]: cabinet override      01/26/19 [id number] 01/26/19 [id number]    .  Recent vital signs:  Vitals:   01/27/19 1201 01/27/19 2102  BP: (!) 145/79 (!) 156/85  Pulse: 75 84  Resp: 18 19  Temp: 98 F (36.7 C) 97.8 F (36.6 C)  SpO2: 97% 95%    Recent laboratory studies:  No results for input(s): WBC, HGB, HCT, PLT, K, CL, CO2, BUN, CREATININE, GLUCOSE, CALCIUM, LABPT, INR in the last 72 hours.  Diagnostic Studies: Dg Knee Left Port  Result Date: 01/26/2019 CLINICAL DATA:  Status post left knee arthroplasty today. EXAM: PORTABLE LEFT KNEE - 1-2 VIEW COMPARISON:  None. FINDINGS: Total knee arthroplasty is in place. Device is located. No fracture or other acute abnormality. Surgical drain and staples noted. IMPRESSION: Status post left knee replacement.  No acute finding. Electronically Signed   By: Marcello Moores  Dalessio M.D.   On: 01/26/2019 11:45    Discharge Medications:   Allergies as of 01/28/2019   No Known Allergies     Medication List    STOP taking these medications   meloxicam 15 MG tablet Commonly known as: MOBIC     TAKE  these medications   celecoxib 200 MG capsule Commonly known as: CELEBREX Take 1 capsule (200 mg total) by mouth 2 (two) times daily.   enoxaparin 40 MG/0.4ML injection Commonly known as: LOVENOX Inject 0.4 mLs (40 mg total) into the skin daily for 14 doses.   fluticasone 50 MCG/ACT nasal spray Commonly known as: FLONASE Place 1 spray into both nostrils daily as needed for allergies or rhinitis.   lisinopril 10 MG tablet Commonly known as: ZESTRIL Take 10 mg by mouth daily.   omeprazole 40 MG capsule Commonly known as: PRILOSEC Take 40 mg by mouth daily.   oxyCODONE 5 MG immediate release tablet Commonly known as: Oxy IR/ROXICODONE Take 1 tablet (5 mg total) by mouth every 4 (four) hours as needed for moderate pain (pain score 4-6).            Durable Medical Equipment  (From admission, onward)         Start     Ordered   01/26/19 1452  DME Walker rolling  Once    Question:  Patient needs a walker to treat with the following condition  Answer:  Total knee replacement status   01/26/19 1451   01/26/19 1452  DME Bedside commode  Once    Question:  Patient needs a bedside commode to treat with the following condition  Answer:  Total knee replacement status   01/26/19 1451          Disposition:     Follow-up Information    Watt Climes, PA On 02/11/2019.   Specialty: Physician Assistant Why: at 8:45am Contact information: [place] [address] [phone]        Dereck Leep, MD On 03/12/2019.   Specialty: Orthopedic Surgery Why: at 2:15pm Contact information: [place] [address] [place], PA-C 01/28/2019, 7:33 AM

## 2019-01-28 NOTE — TOC Transition Note (Signed)
Transition of Care [facility]) - CM/SW Discharge Note   Patient Details  Name: Craig Michael MRN: [id number] Date of Birth: 04/05/1951  Transition of Care [facility]) CM/SW Contact:  Su Hilt, RN Phone Number: 01/28/2019, 9:06 AM   Clinical Narrative:    Patient to DC home today with [facility] for [facility], [place] aware of DC RW in the room from [place], is aware of the lovenox price His wife will transport home, he can afford his medicaiton   Final next level of care: [place] Barriers to Discharge: Barriers Resolved   Patient Goals and CMS Choice Patient states their goals for this hospitalization and ongoing recovery are:: go home CMS Medicare.gov Compare Post Acute Care list provided to:: Patient Choice offered to / list presented to : Patient  Discharge Placement                       Discharge Plan and Services   Discharge Planning Services: CM Consult Post Acute Care Choice: Home Health          DME Arranged: Walker rolling DME Agency: [employer] Date DME Agency Contacted: 01/27/19 Time DME Agency Contacted: [id number] Representative spoke with at DME Agency: [place]: PT [place]: [place] ([place]) Date [place]: 01/27/19 Time [place]: 1149 Representative spoke with at [place]: [place] ([place]) Interventions     Readmission Risk Interventions No flowsheet data found.

## 2019-01-28 NOTE — Progress Notes (Signed)
Physical Therapy Treatment Patient Details Name: Craig Michael MRN: [id number] DOB: March 22, 1951 Today's Date: 01/28/2019    History of Present Illness admitted for acute hospitalization status post L TKR (01/26/19), WBAT.    PT Comments    Excellent progress towards all goals; has met/exceeded all mobility goals established at initial evaluation, performing all at sup/mod indep level with RW.  Demonstrates good L knee strength (4-/5) and ROM (2-85 degrees), with good control/stability during all mobility efforts.  Comfortable and confidence with functional ability; eager for upcoming discharge home.  No additional questions/concerns at this time.     Follow Up Recommendations  Home health PT     Equipment Recommendations  Rolling walker with 5" wheels;3in1 (PT)    Recommendations for Other Services       Precautions / Restrictions Precautions Precautions: Fall;Knee Restrictions Weight Bearing Restrictions: Yes LLE Weight Bearing: Weight bearing as tolerated    Mobility  Bed Mobility Overal bed mobility: Modified Independent                Transfers   Equipment used: Rolling walker (2 wheeled) Transfers: Sit to/from Stand Sit to Stand: Modified independent (Device/Increase time)         General transfer comment: good hand placement; working towards increased active use of L LE with movement transition  Ambulation/Gait Ambulation/Gait assistance: Modified independent (Device/Increase time) Gait Distance (Feet): 220 Feet Assistive device: Rolling walker (2 wheeled)   Gait velocity: 10' walk time, 8-9 seconds   General Gait Details: reciprocal stepping pattern with good L LE stance time; improved fluidity and overall mechanics.   Stairs             Wheelchair Mobility    Modified Rankin (Stroke Patients Only)       Balance Overall balance assessment: Modified Independent                                           Cognition Arousal/Alertness: Awake/alert Behavior During Therapy: WFL for tasks assessed/performed Overall Cognitive Status: Within Functional Limits for tasks assessed                                        Exercises Total Joint Exercises Goniometric ROM: L knee: 2-85 degrees, limited by edema/pain Other Exercises Other Exercises: Standing LE therex, 1x10, act ROM: heel raises, marching, knee flexion, mini squats.  Emphasis on L LE ROM in open chain, TKE in closed-chain.  good control and stability.    General Comments        Pertinent Vitals/Pain Pain Assessment: 0-10 Pain Score: 3  Pain Location: L knee Pain Descriptors / Indicators: Aching;Guarding;Grimacing Pain Intervention(s): Limited activity within patient's tolerance;Monitored during session;Repositioned    Home Living                      Prior Function            PT Goals (current goals can now be found in the care plan section) Acute Rehab PT Goals Patient Stated Goal: go home PT Goal Formulation: With patient Time For Goal Achievement: 02/09/19 Potential to Achieve Goals: Good Progress towards PT goals: Progressing toward goals    Frequency    BID      PT Plan Current plan remains appropriate  Co-evaluation              AM-PAC PT "6 Clicks" Mobility   Outcome Measure  Help needed turning from your back to your side while in a flat bed without using bedrails?: None Help needed moving from lying on your back to sitting on the side of a flat bed without using bedrails?: None Help needed moving to and from a bed to a chair (including a wheelchair)?: None Help needed standing up from a chair using your arms (e.g., wheelchair or bedside chair)?: None Help needed to walk in hospital room?: None Help needed climbing 3-5 steps with a railing? : A Little 6 Click Score: 23    End of Session Equipment Utilized During Treatment: Gait belt Activity Tolerance: Patient  tolerated treatment well Patient left: with call bell/phone within reach;in bed;with bed alarm set;with family/visitor present Nurse Communication: Mobility status PT Visit Diagnosis: Muscle weakness (generalized) (M62.81);Difficulty in walking, not elsewhere classified (R26.2);Pain Pain - Right/Left: Left Pain - part of body: Knee     Time: 5852-7782 PT Time Calculation (min) (ACUTE ONLY): 19 min  Charges:  $Gait Training: 8-22 mins                      Kimorah Ridolfi H. Owens Shark, PT, DPT, NCS 01/28/19, 10:07 AM [phone]

## 2019-01-28 NOTE — Progress Notes (Signed)
  Subjective: 2 Days Post-Op Procedure(s) (LRB): COMPUTER ASSISTED TOTAL KNEE ARTHROPLASTY   RNFA (Left) Patient reports pain as well controlled.   Patient is well, and has had no acute complaints or problems Plan is to go Home after hospital stay. Negative for chest pain and shortness of breath Fever: no Gastrointestinal:Negative for nausea and vomiting  Objective: Vital signs in last 24 hours: Temp:  [97.8 F (36.6 C)-98 F (36.7 C)] 97.8 F (36.6 C) (10/06 2102) Pulse Rate:  [73-84] 84 (10/06 2102) Resp:  [18-19] 19 (10/06 2102) BP: (145-162)/(79-85) 156/85 (10/06 2102) SpO2:  [95 %-100 %] 95 % (10/06 2102)  Intake/Output from previous day:  Intake/Output Summary (Last 24 hours) at 01/28/2019 0704 Last data filed at 01/28/2019 0454 Gross per 24 hour  Intake 240 ml  Output 1850 ml  Net -1610 ml    Intake/Output this shift: No intake/output data recorded.  Labs: No results for input(s): HGB in the last 72 hours. No results for input(s): WBC, RBC, HCT, PLT in the last 72 hours. No results for input(s): NA, K, CL, CO2, BUN, CREATININE, GLUCOSE, CALCIUM in the last 72 hours. No results for input(s): LABPT, INR in the last 72 hours.   EXAM General - Patient is Alert, Appropriate and Oriented Extremity - Neurovascular intact Sensation intact distally Dorsiflexion/Plantar flexion intact Compartment soft Dressing/Incision - clean, no drainage Motor Function - intact, moving foot and toes well on exam.  Able to complete a straight leg raise unassisted. Cardiovascular- Regular rate and rhythm, no murmurs/rubs/gallops Respiratory- Lungs clear to auscultation bilaterally Gastrointestinal- active bowel sounds   Assessment/Plan: 2 Days Post-Op Procedure(s) (LRB): COMPUTER ASSISTED TOTAL KNEE ARTHROPLASTY   RNFA (Left) Active Problems:   Total knee replacement status  Estimated body mass index is 31 kg/m as calculated from the following:   Height as of this encounter: 5'  6.5" (1.689 m).   Weight as of this encounter: 88.5 kg. Advance diet   Drain removed.  Fresh honeycomb dressing applied.  DVT Prophylaxis - Lovenox, foot pumps, TED hose  Weight-Bearing as tolerated to left leg  Cassell Smiles, PA-C [facility] Orthopaedic Surgery 01/28/2019, 7:04 AM

## 2020-08-04 NOTE — Discharge Instructions (Signed)
Instructions after Total Knee Replacement   Craig Michael, Jr., M.D.     Dept. of Orthopaedics & Sports Medicine  [facility]  [address]  [place], [place]  [address]  Phone: [phone]   Fax: [phone]    DIET: Drink plenty of non-alcoholic fluids. Resume your normal diet. Include foods high in fiber.  ACTIVITY:  You may use crutches or a walker with weight-bearing as tolerated, unless instructed otherwise. You may be weaned off of the walker or crutches by your Physical Therapist.  Do NOT place pillows under the knee. Anything placed under the knee could limit your ability to straighten the knee.   Continue doing gentle exercises. Exercising will reduce the pain and swelling, increase motion, and prevent muscle weakness.   Please continue to use the TED compression stockings for 6 weeks. You may remove the stockings at night, but should reapply them in the morning. Do not drive or operate any equipment until instructed.  WOUND CARE:  Continue to use the PolarCare or ice packs periodically to reduce pain and swelling. You may bathe or shower after the staples are removed at the first office visit following surgery.  MEDICATIONS: You may resume your regular medications. Please take the pain medication as prescribed on the medication. Do not take pain medication on an empty stomach. You have been given a prescription for a blood thinner (Lovenox or Coumadin). Please take the medication as instructed. (NOTE: After completing a 2 week course of Lovenox, take one Enteric-coated aspirin once a day. This along with elevation will help reduce the possibility of phlebitis in your operated leg.) Do not drive or drink alcoholic beverages when taking pain medications.  CALL THE OFFICE FOR: Temperature above 101 degrees Excessive bleeding or drainage on the dressing. Excessive swelling, coldness, or paleness of the toes. Persistent nausea and vomiting.  FOLLOW-UP:  You  should have an appointment to return to the office in 10-14 days after surgery. Arrangements have been made for continuation of Physical Therapy (either home therapy or outpatient therapy).   [facility] Department Directory         [email or web address]       [email or web address]          Cardiology  Appointments: [place] - [phone] [place] - [phone]  Endocrinology  Appointments: [place] - [phone] [place] - [phone]  Gastroenterology  Appointments: [place] - [phone] [place] - [phone]        General Surgery   Appointments: [place] - [phone]  Internal Medicine/Family Medicine  Appointments: [place] - [phone] [place] - [phone] [place] - [phone]  Metabolic and Weigh Loss Surgery  Appointments: [place] - [phone]        Neurology  Appointments: [place] - [phone] [place] - [phone]  Neurosurgery  Appointments: [place] - [phone]  Obstetrics & Gynecology  Appointments: [place] - [phone] [place] - [phone]        Pediatrics  Appointments: [place] - [phone] [place] - [phone]  Physiatry  Appointments: [place] -[phone]  Physical Therapy  Appointments: [place] - [phone] [place] - [phone]        Podiatry  Appointments: [place] - [phone] [place] - [phone]  Pulmonology  Appointments: [place] - [phone]  Rheumatology  Appointments: [place] - [phone]        [place] Location: [facility]  [address] , [place]  [address]  [place] Location: [facility] [address] [place], [place]  [address]  [place] Location: [facility] [address] [place], [place]  [address]    

## 2020-08-08 ENCOUNTER — Encounter
Admission: RE | Admit: 2020-08-08 | Discharge: 2020-08-08 | Disposition: A | Discharge status: Home / Self Care | Payer: Medicare HMO | Source: Ambulatory Visit | Attending: Orthopedic Surgery | Admitting: Orthopedic Surgery

## 2020-08-08 ENCOUNTER — Other Ambulatory Visit: Payer: Self-pay

## 2020-08-08 DIAGNOSIS — Z01818 Encounter for other preprocedural examination: Secondary | ICD-10-CM | POA: Diagnosis not present

## 2020-08-08 LAB — CBC
HCT: 42.7 % (ref 39.0–52.0)
Hemoglobin: 14.3 g/dL (ref 13.0–17.0)
MCH: 30.2 pg (ref 26.0–34.0)
MCHC: 33.5 g/dL (ref 30.0–36.0)
MCV: 90.1 fL (ref 80.0–100.0)
Platelets: 192 10*3/uL (ref 150–400)
RBC: 4.74 MIL/uL (ref 4.22–5.81)
RDW: 13.3 % (ref 11.5–15.5)
WBC: 4.7 10*3/uL (ref 4.0–10.5)
nRBC: 0 % (ref 0.0–0.2)

## 2020-08-08 LAB — COMPREHENSIVE METABOLIC PANEL
ALT: 23 U/L (ref 0–44)
AST: 22 U/L (ref 15–41)
Albumin: 4 g/dL (ref 3.5–5.0)
Alkaline Phosphatase: 51 U/L (ref 38–126)
Anion gap: 8 (ref 5–15)
BUN: 17 mg/dL (ref 8–23)
CO2: 26 mmol/L (ref 22–32)
Calcium: 8.9 mg/dL (ref 8.9–10.3)
Chloride: 104 mmol/L (ref 98–111)
Creatinine, Ser: 1.03 mg/dL (ref 0.61–1.24)
GFR, Estimated: >60 mL/min (ref >60)
Glucose, Bld: 91 mg/dL (ref 70–99)
Potassium: 4.3 mmol/L (ref 3.5–5.1)
Sodium: 138 mmol/L (ref 135–145)
Total Bilirubin: 0.8 mg/dL (ref 0.3–1.2)
Total Protein: 6.6 g/dL (ref 6.5–8.1)

## 2020-08-08 LAB — URINALYSIS, ROUTINE W REFLEX MICROSCOPIC
Bilirubin Urine: NEGATIVE
Glucose, UA: NEGATIVE mg/dL
Hgb urine dipstick: NEGATIVE
Ketones, ur: NEGATIVE mg/dL
Leukocytes,Ua: NEGATIVE
Nitrite: NEGATIVE
Protein, ur: NEGATIVE mg/dL
Specific Gravity, Urine: 1.009 (ref 1.005–1.030)
pH: 5 (ref 5.0–8.0)

## 2020-08-08 LAB — SURGICAL PCR SCREEN
MRSA, PCR: NEGATIVE
Staphylococcus aureus: NEGATIVE

## 2020-08-08 LAB — SEDIMENTATION RATE: Sed Rate: 2 mm/hr (ref 0–20)

## 2020-08-08 LAB — C-REACTIVE PROTEIN: CRP: 0.6 mg/dL (ref <1.0)

## 2020-08-08 LAB — PROTIME-INR
INR: 0.9 (ref 0.8–1.2)
Prothrombin Time: 12.1 seconds (ref 11.4–15.2)

## 2020-08-08 LAB — APTT: aPTT: 28 seconds (ref 24–36)

## 2020-08-08 NOTE — Patient Instructions (Addendum)
Your procedure is scheduled on: 08/15/20 Report to [place]. To find out your arrival time please call [phone] between 1PM - 3PM on 08/12/20.  Remember: Instructions that are not followed completely may result in serious medical risk, up to and including death, or upon the discretion of your surgeon and anesthesiologist your surgery may need to be rescheduled.     _X__ 1. Do not eat food after midnight the night before your procedure.                 No gum chewing or hard candies. You may drink clear liquids up to 2 hours                 before you are scheduled to arrive for your surgery- DO not drink clear                 liquids within 2 hours of the start of your surgery.                 Clear Liquids include:  water, apple juice without pulp, clear carbohydrate                 drink such as Clearfast or Gatorade, Black Coffee or Tea (Do not add                 anything to coffee or tea). Diabetics water only  __X__2.  On the morning of surgery brush your teeth with toothpaste and water, you                 may rinse your mouth with mouthwash if you wish.  Do not swallow any              toothpaste of mouthwash.     _X__ 3.  No Alcohol for 24 hours before or after surgery.   _X__ 4.  Do Not Smoke or use e-cigarettes For 24 Hours Prior to Your Surgery.                 Do not use any chewable tobacco products for at least 6 hours prior to                 surgery.  ____  5.  Bring all medications with you on the day of surgery if instructed.   __X__  6.  Notify your doctor if there is any change in your medical condition      (cold, fever, infections).     Do not wear jewelry, make-up, hairpins, clips or nail polish. Do not wear lotions, powders, or perfumes.  Do not shave 48 hours prior to surgery. Men may shave face and neck. Do not bring valuables to the hospital.    [facility] is not responsible for any belongings or  valuables.  Contacts, dentures/partials or body piercings may not be worn into surgery. Bring a case for your contacts, glasses or hearing aids, a denture cup will be supplied. Leave your suitcase in the car. After surgery it may be brought to your room. For patients admitted to the hospital, discharge time is determined by your treatment team.   Patients discharged the day of surgery will not be allowed to drive home.   Please read over the following fact sheets that you were given:   MRSA Information  __X__ Take these medicines the morning of surgery with A SIP OF WATER:  1. omeprazole (PRILOSEC) 20 MG capsule  2.   3.   4.  5.  6.  ____ Fleet Enema (as directed)   __X__ Use CHG Soap/SAGE wipes as directed  ____ Use inhalers on the day of surgery  ____ Stop metformin/Janumet/Farxiga 2 days prior to surgery    ____ Take 1/2 of usual insulin dose the night before surgery. No insulin the morning          of surgery.   ____ Stop Blood Thinners Coumadin/Plavix/Xarelto/Pleta/Pradaxa/Eliquis/Effient/Aspirin  on   Or contact your Surgeon, Cardiologist or Medical Doctor regarding  ability to stop your blood thinners  __X__ Stop Anti-inflammatories 7 days before surgery such as Advil, Ibuprofen, Motrin,  BC or Goodies Powder, Naprosyn, Naproxen, Aleve, Aspirin    __X__ Stop all herbal supplements, fish oil or vitamin E until after surgery.    ____ Bring C-Pap to the hospital.     Call back at [phone] to clarify your Prilosec Omeprazole dosage

## 2020-08-09 LAB — URINE CULTURE
Culture: NO GROWTH
Special Requests: NORMAL

## 2020-08-09 LAB — TYPE AND SCREEN
ABO/RH(D): B POS
Antibody Screen: NEGATIVE

## 2020-08-12 ENCOUNTER — Other Ambulatory Visit
Admission: RE | Admit: 2020-08-12 | Discharge: 2020-08-12 | Disposition: A | Discharge status: Home / Self Care | Payer: Medicare HMO | Source: Ambulatory Visit | Attending: Orthopedic Surgery | Admitting: Orthopedic Surgery

## 2020-08-12 ENCOUNTER — Other Ambulatory Visit: Payer: Self-pay

## 2020-08-12 DIAGNOSIS — Z20822 Contact with and (suspected) exposure to covid-19: Secondary | ICD-10-CM | POA: Insufficient documentation

## 2020-08-12 DIAGNOSIS — Z01812 Encounter for preprocedural laboratory examination: Secondary | ICD-10-CM | POA: Insufficient documentation

## 2020-08-12 LAB — SARS CORONAVIRUS 2 (TAT 6-24 HRS): SARS Coronavirus 2: NEGATIVE

## 2020-08-15 ENCOUNTER — Encounter
Admission: RE | Disposition: A | Discharge status: Home / Self Care | Payer: Self-pay | Source: Ambulatory Visit | Attending: Orthopedic Surgery

## 2020-08-15 ENCOUNTER — Observation Stay
Admission: RE | Admit: 2020-08-15 | Discharge: 2020-08-16 | Disposition: A | Discharge status: Home / Self Care | Payer: Medicare HMO | Source: Ambulatory Visit | Attending: Orthopedic Surgery | Admitting: Orthopedic Surgery

## 2020-08-15 ENCOUNTER — Encounter: Payer: Self-pay | Admitting: Orthopedic Surgery

## 2020-08-15 ENCOUNTER — Observation Stay: Payer: Medicare HMO

## 2020-08-15 ENCOUNTER — Other Ambulatory Visit: Payer: Self-pay

## 2020-08-15 ENCOUNTER — Ambulatory Visit: Payer: Medicare HMO | Admitting: Anesthesiology

## 2020-08-15 DIAGNOSIS — I10 Essential (primary) hypertension: Secondary | ICD-10-CM | POA: Diagnosis not present

## 2020-08-15 DIAGNOSIS — M1711 Unilateral primary osteoarthritis, right knee: Secondary | ICD-10-CM | POA: Diagnosis not present

## 2020-08-15 DIAGNOSIS — Z96652 Presence of left artificial knee joint: Secondary | ICD-10-CM | POA: Insufficient documentation

## 2020-08-15 DIAGNOSIS — Z87891 Personal history of nicotine dependence: Secondary | ICD-10-CM | POA: Diagnosis not present

## 2020-08-15 DIAGNOSIS — Z79899 Other long term (current) drug therapy: Secondary | ICD-10-CM | POA: Diagnosis not present

## 2020-08-15 DIAGNOSIS — Z7982 Long term (current) use of aspirin: Secondary | ICD-10-CM | POA: Insufficient documentation

## 2020-08-15 DIAGNOSIS — Z96659 Presence of unspecified artificial knee joint: Secondary | ICD-10-CM

## 2020-08-15 DIAGNOSIS — M25562 Pain in left knee: Secondary | ICD-10-CM | POA: Diagnosis present

## 2020-08-15 HISTORY — PX: KNEE ARTHROPLASTY: SHX992

## 2020-08-15 SURGERY — ARTHROPLASTY, KNEE, TOTAL, USING IMAGELESS COMPUTER-ASSISTED NAVIGATION
Anesthesia: Spinal | Site: Knee | Laterality: Right

## 2020-08-15 MED ORDER — OXYCODONE HCL 5 MG/5ML PO SOLN: 5.0000 mg | Freq: Once | ORAL | Status: DC | PRN

## 2020-08-15 MED ORDER — BUPIVACAINE HCL (PF) 0.5 % IJ SOLN
INTRAMUSCULAR | Status: DC | PRN
  Administered 2020-08-15: 2.9 mL via INTRATHECAL

## 2020-08-15 MED ORDER — ORAL CARE MOUTH RINSE: 15.0000 mL | Freq: Once | OROMUCOSAL | Status: AC

## 2020-08-15 MED ORDER — LISINOPRIL 20 MG PO TABS
20.0000 mg | ORAL_TABLET | Freq: Every day | ORAL | Status: DC
  Administered 2020-08-16: 20 mg via ORAL
  Filled 2020-08-15: qty 1

## 2020-08-15 MED ORDER — PROPOFOL 500 MG/50ML IV EMUL
INTRAVENOUS | Status: DC | PRN
  Administered 2020-08-15: 200 ug/kg/min via INTRAVENOUS

## 2020-08-15 MED ORDER — GLYCOPYRROLATE 0.2 MG/ML IJ SOLN
INTRAMUSCULAR | Status: DC | PRN
  Administered 2020-08-15: .2 mg via INTRAVENOUS

## 2020-08-15 MED ORDER — FLEET ENEMA 7-19 GM/118ML RE ENEM: 1.0000 | ENEMA | Freq: Once | RECTAL | Status: DC | PRN

## 2020-08-15 MED ORDER — CHLORHEXIDINE GLUCONATE 4 % EX LIQD: 60.0000 mL | Freq: Once | CUTANEOUS | Status: DC

## 2020-08-15 MED ORDER — OXYCODONE HCL 5 MG PO TABS: 5.0000 mg | ORAL_TABLET | Freq: Once | ORAL | Status: DC | PRN

## 2020-08-15 MED ORDER — OXYCODONE HCL 5 MG PO TABS
5.0000 mg | ORAL_TABLET | ORAL | Status: DC | PRN
Start: 2020-08-15 — End: 2020-08-16
  Administered 2020-08-16: 5 mg via ORAL
  Filled 2020-08-15: qty 1

## 2020-08-15 MED ORDER — LACTATED RINGERS IV SOLN: INTRAVENOUS | Status: DC

## 2020-08-15 MED ORDER — OXYCODONE HCL 5 MG PO TABS
10.0000 mg | ORAL_TABLET | ORAL | Status: DC | PRN
  Administered 2020-08-15: 10 mg via ORAL
  Filled 2020-08-15: qty 2

## 2020-08-15 MED ORDER — BUPIVACAINE HCL (PF) 0.5 % IJ SOLN: INTRAMUSCULAR | Status: DC | PRN

## 2020-08-15 MED ORDER — MAGNESIUM HYDROXIDE 400 MG/5ML PO SUSP
30.0000 mL | Freq: Every day | ORAL | Status: DC
  Administered 2020-08-15 – 2020-08-16 (×2): 30 mL via ORAL
  Filled 2020-08-15 (×2): qty 30

## 2020-08-15 MED ORDER — TRANEXAMIC ACID-NACL 1000-0.7 MG/100ML-% IV SOLN
INTRAVENOUS | Status: AC
  Administered 2020-08-15: 1000 mg via INTRAVENOUS
  Filled 2020-08-15: qty 100

## 2020-08-15 MED ORDER — CELECOXIB 200 MG PO CAPS
400.0000 mg | ORAL_CAPSULE | Freq: Once | ORAL | Status: AC
  Administered 2020-08-15: 400 mg via ORAL

## 2020-08-15 MED ORDER — CEFAZOLIN SODIUM-DEXTROSE 2-4 GM/100ML-% IV SOLN
2.0000 g | Freq: Four times a day (QID) | INTRAVENOUS | Status: AC
  Administered 2020-08-15 (×2): 2 g via INTRAVENOUS
  Filled 2020-08-15 (×2): qty 100

## 2020-08-15 MED ORDER — MIDAZOLAM HCL 5 MG/5ML IJ SOLN
INTRAMUSCULAR | Status: DC | PRN
  Administered 2020-08-15: 2 mg via INTRAVENOUS

## 2020-08-15 MED ORDER — FENTANYL CITRATE (PF) 100 MCG/2ML IJ SOLN: 25.0000 ug | INTRAMUSCULAR | Status: DC | PRN

## 2020-08-15 MED ORDER — MENTHOL 3 MG MT LOZG
1.0000 | LOZENGE | OROMUCOSAL | Status: DC | PRN
  Filled 2020-08-15: qty 9

## 2020-08-15 MED ORDER — SENNOSIDES-DOCUSATE SODIUM 8.6-50 MG PO TABS
1.0000 | ORAL_TABLET | Freq: Two times a day (BID) | ORAL | Status: DC
  Administered 2020-08-15 – 2020-08-16 (×2): 1 via ORAL
  Filled 2020-08-15 (×2): qty 1

## 2020-08-15 MED ORDER — BISACODYL 10 MG RE SUPP: 10.0000 mg | Freq: Every day | RECTAL | Status: DC | PRN

## 2020-08-15 MED ORDER — PHENOL 1.4 % MT LIQD
1.0000 | OROMUCOSAL | Status: DC | PRN
  Filled 2020-08-15: qty 177

## 2020-08-15 MED ORDER — CEFAZOLIN SODIUM-DEXTROSE 2-4 GM/100ML-% IV SOLN
2.0000 g | INTRAVENOUS | Status: AC
  Administered 2020-08-15: 2 g via INTRAVENOUS

## 2020-08-15 MED ORDER — HYDROMORPHONE HCL 1 MG/ML IJ SOLN: 0.5000 mg | INTRAMUSCULAR | Status: DC | PRN

## 2020-08-15 MED ORDER — CHLORHEXIDINE GLUCONATE 0.12 % MT SOLN
15.0000 mL | Freq: Once | OROMUCOSAL | Status: AC
  Administered 2020-08-15: 15 mL via OROMUCOSAL

## 2020-08-15 MED ORDER — ONDANSETRON HCL 4 MG/2ML IJ SOLN: 4.0000 mg | Freq: Once | INTRAMUSCULAR | Status: DC | PRN

## 2020-08-15 MED ORDER — ENOXAPARIN SODIUM 30 MG/0.3ML ~~LOC~~ SOLN
30.0000 mg | Freq: Two times a day (BID) | SUBCUTANEOUS | Status: DC
  Administered 2020-08-16: 30 mg via SUBCUTANEOUS
  Filled 2020-08-15: qty 0.3

## 2020-08-15 MED ORDER — SODIUM CHLORIDE 0.9 % IV SOLN
INTRAVENOUS | Status: DC | PRN
  Administered 2020-08-15: 25 ug/min via INTRAVENOUS

## 2020-08-15 MED ORDER — DIPHENHYDRAMINE HCL 12.5 MG/5ML PO ELIX
12.5000 mg | ORAL_SOLUTION | ORAL | Status: DC | PRN
Start: 2020-08-15 — End: 2020-08-16

## 2020-08-15 MED ORDER — ACETAMINOPHEN 10 MG/ML IV SOLN: 1000.0000 mg | Freq: Once | INTRAVENOUS | Status: DC | PRN

## 2020-08-15 MED ORDER — ADULT MULTIVITAMIN W/MINERALS CH
1.0000 | ORAL_TABLET | Freq: Every day | ORAL | Status: DC
  Administered 2020-08-16: 1 via ORAL
  Filled 2020-08-15: qty 1

## 2020-08-15 MED ORDER — SURGIPHOR WOUND IRRIGATION SYSTEM - OPTIME
TOPICAL | Status: DC | PRN
  Administered 2020-08-15: 1 via TOPICAL

## 2020-08-15 MED ORDER — ALUM & MAG HYDROXIDE-SIMETH 200-200-20 MG/5ML PO SUSP: 30.0000 mL | ORAL | Status: DC | PRN

## 2020-08-15 MED ORDER — METOCLOPRAMIDE HCL 10 MG PO TABS
10.0000 mg | ORAL_TABLET | Freq: Three times a day (TID) | ORAL | Status: DC
  Administered 2020-08-15 – 2020-08-16 (×4): 10 mg via ORAL
  Filled 2020-08-15 (×4): qty 1

## 2020-08-15 MED ORDER — DEXAMETHASONE SODIUM PHOSPHATE 10 MG/ML IJ SOLN
8.0000 mg | Freq: Once | INTRAMUSCULAR | Status: AC
  Administered 2020-08-15: 8 mg via INTRAVENOUS

## 2020-08-15 MED ORDER — TRAMADOL HCL 50 MG PO TABS: 50.0000 mg | ORAL_TABLET | ORAL | Status: DC | PRN

## 2020-08-15 MED ORDER — TRANEXAMIC ACID-NACL 1000-0.7 MG/100ML-% IV SOLN
INTRAVENOUS | Status: AC
  Filled 2020-08-15: qty 100

## 2020-08-15 MED ORDER — SODIUM CHLORIDE 0.9 % IV SOLN
INTRAVENOUS | Status: DC | PRN
  Administered 2020-08-15: 60 mL

## 2020-08-15 MED ORDER — NEOMYCIN-POLYMYXIN B GU 40-200000 IR SOLN
Status: DC | PRN
  Administered 2020-08-15: 16 mL

## 2020-08-15 MED ORDER — ACETAMINOPHEN 10 MG/ML IV SOLN
INTRAVENOUS | Status: DC | PRN
  Administered 2020-08-15: 1000 mg via INTRAVENOUS

## 2020-08-15 MED ORDER — CELECOXIB 200 MG PO CAPS
200.0000 mg | ORAL_CAPSULE | Freq: Two times a day (BID) | ORAL | Status: DC
  Administered 2020-08-15 – 2020-08-16 (×2): 200 mg via ORAL
  Filled 2020-08-15 (×2): qty 1

## 2020-08-15 MED ORDER — BUPIVACAINE HCL (PF) 0.25 % IJ SOLN
INTRAMUSCULAR | Status: DC | PRN
  Administered 2020-08-15: 60 mL

## 2020-08-15 MED ORDER — PANTOPRAZOLE SODIUM 40 MG PO TBEC
40.0000 mg | DELAYED_RELEASE_TABLET | Freq: Two times a day (BID) | ORAL | Status: DC
  Administered 2020-08-15 – 2020-08-16 (×2): 40 mg via ORAL
  Filled 2020-08-15 (×2): qty 1

## 2020-08-15 MED ORDER — GABAPENTIN 300 MG PO CAPS
300.0000 mg | ORAL_CAPSULE | Freq: Once | ORAL | Status: AC
  Administered 2020-08-15: 300 mg via ORAL

## 2020-08-15 MED ORDER — ACETAMINOPHEN 325 MG PO TABS: 325.0000 mg | ORAL_TABLET | Freq: Four times a day (QID) | ORAL | Status: DC | PRN

## 2020-08-15 MED ORDER — ONDANSETRON HCL 4 MG PO TABS: 4.0000 mg | ORAL_TABLET | Freq: Four times a day (QID) | ORAL | Status: DC | PRN

## 2020-08-15 MED ORDER — SODIUM CHLORIDE 0.9 % IV SOLN: INTRAVENOUS | Status: DC

## 2020-08-15 MED ORDER — FERROUS SULFATE 325 (65 FE) MG PO TABS
325.0000 mg | ORAL_TABLET | Freq: Two times a day (BID) | ORAL | Status: DC
  Administered 2020-08-15 – 2020-08-16 (×2): 325 mg via ORAL
  Filled 2020-08-15 (×2): qty 1

## 2020-08-15 MED ORDER — TRANEXAMIC ACID-NACL 1000-0.7 MG/100ML-% IV SOLN
1000.0000 mg | INTRAVENOUS | Status: AC
  Administered 2020-08-15: 1000 mg via INTRAVENOUS

## 2020-08-15 MED ORDER — ONDANSETRON HCL 4 MG/2ML IJ SOLN: 4.0000 mg | Freq: Four times a day (QID) | INTRAMUSCULAR | Status: DC | PRN

## 2020-08-15 MED ORDER — TRANEXAMIC ACID-NACL 1000-0.7 MG/100ML-% IV SOLN: 1000.0000 mg | Freq: Once | INTRAVENOUS | Status: AC

## 2020-08-15 MED ORDER — ACETAMINOPHEN 10 MG/ML IV SOLN
1000.0000 mg | Freq: Four times a day (QID) | INTRAVENOUS | Status: AC
  Administered 2020-08-15 – 2020-08-16 (×4): 1000 mg via INTRAVENOUS
  Filled 2020-08-15 (×5): qty 100

## 2020-08-15 SURGICAL SUPPLY — 74 items
ATTUNE MED DOME PAT 38 KNEE (Knees) ×1 IMPLANT
ATTUNE PS FEM RT SZ 5 CEM KNEE (Femur) ×1 IMPLANT
ATTUNE PSRP INSR SZ5 5 KNEE (Insert) ×1 IMPLANT
BASE TIBIAL ROT PLAT SZ 5 KNEE (Knees) IMPLANT
BATTERY INSTRU NAVIGATION (MISCELLANEOUS) ×8 IMPLANT
BLADE SAW 70X12.5 (BLADE) ×2 IMPLANT
BLADE SAW 90X13X1.19 OSCILLAT (BLADE) ×2 IMPLANT
BLADE SAW 90X25X1.19 OSCILLAT (BLADE) ×2 IMPLANT
CEMENT BONE GENTAMICIN (Cement) IMPLANT
CEMENT HV SMART SET (Cement) ×2 IMPLANT
COOLER POLAR GLACIER W/PUMP (MISCELLANEOUS) ×2 IMPLANT
COVER WAND RF STERILE (DRAPES) ×2 IMPLANT
CUFF TOURN SGL QUICK 24 (TOURNIQUET CUFF) ×1
CUFF TRNQT CYL 24X4X16.5-23 (TOURNIQUET CUFF) IMPLANT
DRAPE 3/4 80X56 (DRAPES) ×2 IMPLANT
DRESSING PEEL AND PLAC PRVNA20 (GAUZE/BANDAGES/DRESSINGS) ×1 IMPLANT
DRSG DERMACEA 8X12 NADH (GAUZE/BANDAGES/DRESSINGS) ×2 IMPLANT
DRSG MEPILEX SACRM 8.7X9.8 (GAUZE/BANDAGES/DRESSINGS) ×2 IMPLANT
DRSG OPSITE POSTOP 4X14 (GAUZE/BANDAGES/DRESSINGS) ×2 IMPLANT
DRSG PEEL AND PLACE PREVENA 20 (GAUZE/BANDAGES/DRESSINGS) ×2
DRSG TEGADERM 4X4.75 (GAUZE/BANDAGES/DRESSINGS) ×2 IMPLANT
DURAPREP 26ML APPLICATOR (WOUND CARE) ×4 IMPLANT
ELECT CAUTERY BLADE 6.4 (BLADE) ×2 IMPLANT
ELECT REM PT RETURN 9FT ADLT (ELECTROSURGICAL) ×2
ELECTRODE REM PT RTRN 9FT ADLT (ELECTROSURGICAL) ×1 IMPLANT
EX-PIN ORTHOLOCK NAV 4X150 (PIN) ×4 IMPLANT
GLOVE SURG ENC MOIS LTX SZ7.5 (GLOVE) ×4 IMPLANT
GLOVE SURG ENC TEXT LTX SZ7.5 (GLOVE) ×4 IMPLANT
GLOVE SURG UNDER LTX SZ8 (GLOVE) ×2 IMPLANT
GLOVE SURG UNDER POLY LF SZ7.5 (GLOVE) ×2 IMPLANT
GOWN STRL REUS W/ TWL LRG LVL3 (GOWN DISPOSABLE) ×2 IMPLANT
GOWN STRL REUS W/ TWL XL LVL3 (GOWN DISPOSABLE) ×1 IMPLANT
GOWN STRL REUS W/TWL LRG LVL3 (GOWN DISPOSABLE) ×2
GOWN STRL REUS W/TWL XL LVL3 (GOWN DISPOSABLE) ×1
HEMOVAC 400CC 10FR (MISCELLANEOUS) ×2 IMPLANT
HOLDER FOLEY CATH W/STRAP (MISCELLANEOUS) ×2 IMPLANT
HOOD PEEL AWAY FLYTE STAYCOOL (MISCELLANEOUS) ×4 IMPLANT
IRRIGATION SURGIPHOR STRL (IV SOLUTION) ×2 IMPLANT
IV NS IRRIG 3000ML ARTHROMATIC (IV SOLUTION) ×2 IMPLANT
KIT TURNOVER KIT A (KITS) ×2 IMPLANT
KNIFE SCULPS 14X20 (INSTRUMENTS) ×2 IMPLANT
LABEL OR SOLS (LABEL) ×2 IMPLANT
MANIFOLD NEPTUNE II (INSTRUMENTS) ×4 IMPLANT
NDL SAFETY ECLIPSE 18X1.5 (NEEDLE) ×1 IMPLANT
NDL SPNL 20GX3.5 QUINCKE YW (NEEDLE) ×2 IMPLANT
NEEDLE HYPO 18GX1.5 SHARP (NEEDLE) ×1
NEEDLE SPNL 20GX3.5 QUINCKE YW (NEEDLE) ×4 IMPLANT
NS IRRIG 500ML POUR BTL (IV SOLUTION) ×2 IMPLANT
PACK TOTAL KNEE (MISCELLANEOUS) ×2 IMPLANT
PAD WRAPON POLAR KNEE (MISCELLANEOUS) ×1 IMPLANT
PENCIL SMOKE EVACUATOR COATED (MISCELLANEOUS) ×2 IMPLANT
PIN DRILL QUICK PACK ×2 IMPLANT
PIN FIXATION 1/8DIA X 3INL (PIN) ×6 IMPLANT
PULSAVAC PLUS IRRIG FAN TIP (DISPOSABLE) ×2
SOL PREP PVP 2OZ (MISCELLANEOUS) ×2
SOLUTION PREP PVP 2OZ (MISCELLANEOUS) ×1 IMPLANT
SPONGE DRAIN TRACH 4X4 STRL 2S (GAUZE/BANDAGES/DRESSINGS) ×2 IMPLANT
STAPLER SKIN PROX 35W (STAPLE) ×2 IMPLANT
STOCKINETTE IMPERV 14X48 (MISCELLANEOUS) ×1 IMPLANT
STRAP TIBIA SHORT (MISCELLANEOUS) ×2 IMPLANT
SUCTION FRAZIER HANDLE 10FR (MISCELLANEOUS) ×2
SUCTION TUBE FRAZIER 10FR DISP (MISCELLANEOUS) ×1 IMPLANT
SUT VIC AB 0 CT1 36 (SUTURE) ×4 IMPLANT
SUT VIC AB 1 CT1 36 (SUTURE) ×4 IMPLANT
SUT VIC AB 2-0 CT2 27 (SUTURE) ×2 IMPLANT
SYR 20ML LL LF (SYRINGE) ×2 IMPLANT
SYR 30ML LL (SYRINGE) ×4 IMPLANT
TIBIAL BASE ROT PLAT SZ 5 KNEE (Knees) ×2 IMPLANT
TIP FAN IRRIG PULSAVAC PLUS (DISPOSABLE) ×1 IMPLANT
TOWEL OR 17X26 4PK STRL BLUE (TOWEL DISPOSABLE) ×2 IMPLANT
TOWER CARTRIDGE SMART MIX (DISPOSABLE) ×2 IMPLANT
TRAY FOLEY MTR SLVR 16FR STAT (SET/KITS/TRAYS/PACK) ×2 IMPLANT
TUBING CONNECTING 10 (TUBING) ×1 IMPLANT
WRAPON POLAR PAD KNEE (MISCELLANEOUS) ×2

## 2020-08-15 NOTE — H&P (Signed)
The patient has been re-examined, and the chart reviewed, and there have been no interval changes to the documented history and physical.    The risks, benefits, and alternatives have been discussed at length. The patient expressed understanding of the risks benefits and agreed with plans for surgical intervention.  Aldine Grainger P. Rehema [place], Jr. M.D.    

## 2020-08-15 NOTE — Anesthesia Preprocedure Evaluation (Signed)
Anesthesia Evaluation  Patient identified by MRN, date of birth, ID band Patient awake    Reviewed: Allergy & Precautions, NPO status , Patient's Chart, lab work & pertinent test results  History of Anesthesia Complications Negative for: history of anesthetic complications  Airway Mallampati: III  TM Distance: >3 FB Neck ROM: Full    Dental no notable dental hx. (+) Teeth Intact   Pulmonary neg pulmonary ROS, neg sleep apnea, neg COPD, Patient abstained from smoking.Not current smoker, former smoker,    Pulmonary exam normal breath sounds clear to auscultation       Cardiovascular Exercise Tolerance: Good METShypertension, (-) CAD and (-) Past MI (-) dysrhythmias  Rhythm:Regular Rate:Normal - Systolic murmurs    Neuro/Psych negative neurological ROS  negative psych ROS   GI/Hepatic GERD  Medicated,(+)     (-) substance abuse  ,   Endo/Other  neg diabetes  Renal/GU negative Renal ROS     Musculoskeletal  (+) Arthritis , Osteoarthritis,    Abdominal   Peds  Hematology   Anesthesia Other Findings Past Medical History: No date: Arthritis No date: GERD (gastroesophageal reflux disease) No date: Hypertension  Reproductive/Obstetrics                             Anesthesia Physical Anesthesia Plan  ASA: II  Anesthesia Plan: General/Spinal   Post-op Pain Management:    Induction: Intravenous  PONV Risk Score and Plan: 2 and Ondansetron, Dexamethasone, Propofol infusion, TIVA, Midazolam and Treatment may vary due to age or medical condition  Airway Management Planned: Natural Airway  Additional Equipment: None  Intra-op Plan:   Post-operative Plan:   Informed Consent: I have reviewed the patients History and Physical, chart, labs and discussed the procedure including the risks, benefits and alternatives for the proposed anesthesia with the patient or authorized representative who  has indicated his/her understanding and acceptance.       Plan Discussed with: CRNA and Surgeon  Anesthesia Plan Comments: (Discussed R/B/A of neuraxial anesthesia technique with patient: - rare risks of spinal/epidural hematoma, nerve damage, infection - Risk of PDPH - Risk of nausea and vomiting - Risk of conversion to general anesthesia and its associated risks, including sore throat, damage to lips/teeth/oropharynx, and rare risks such as cardiac and respiratory events.  Patient voiced understanding.)        Anesthesia Quick Evaluation

## 2020-08-15 NOTE — Plan of Care (Signed)
  Problem: Education: Goal: Knowledge of General Education information will improve Description Including pain rating scale, medication(s)/side effects and non-pharmacologic comfort measures Outcome: Progressing   Problem: Health Behavior/Discharge Planning: Goal: Ability to manage health-related needs will improve Outcome: Progressing   Problem: Clinical Measurements: Goal: Ability to maintain clinical measurements within normal limits will improve Outcome: Progressing Goal: Will remain free from infection Outcome: Progressing Goal: Diagnostic test results will improve Outcome: Progressing Goal: Respiratory complications will improve Outcome: Progressing Goal: Cardiovascular complication will be avoided Outcome: Progressing   Problem: Activity: Goal: Risk for activity intolerance will decrease Outcome: Progressing   Problem: Nutrition: Goal: Adequate nutrition will be maintained Outcome: Progressing   Problem: Coping: Goal: Level of anxiety will decrease Outcome: Progressing   Problem: Elimination: Goal: Will not experience complications related to bowel motility Outcome: Progressing Goal: Will not experience complications related to urinary retention Outcome: Progressing   Problem: Pain Managment: Goal: General experience of comfort will improve Outcome: Progressing   Problem: Safety: Goal: Ability to remain free from injury will improve Outcome: Progressing   Problem: Skin Integrity: Goal: Risk for impaired skin integrity will decrease Outcome: Progressing   Problem: Education: Goal: Knowledge of the prescribed therapeutic regimen will improve Outcome: Progressing Goal: Individualized Educational Video(s) Outcome: Progressing   Problem: Activity: Goal: Ability to avoid complications of mobility impairment will improve Outcome: Progressing Goal: Range of joint motion will improve Outcome: Progressing   Problem: Clinical Measurements: Goal:  Postoperative complications will be avoided or minimized Outcome: Progressing   

## 2020-08-15 NOTE — Anesthesia Procedure Notes (Signed)
Spinal  Patient location during procedure: OR Start time: 08/15/2020 7:15 AM End time: 08/15/2020 7:42 AM Reason for block: surgical anesthesia Staffing Performed: anesthesiologist  Anesthesiologist: Arita Miss, MD Resident/CRNA: Nelda Marseille, CRNA Preanesthetic Checklist Completed: patient identified, IV checked, site marked, risks and benefits discussed, surgical consent, monitors and equipment checked, pre-op evaluation and timeout performed Spinal Block Patient position: sitting Prep: ChloraPrep Patient monitoring: heart rate, continuous pulse ox, blood pressure and cardiac monitor Approach: midline Location: L3-4 Injection technique: single-shot Needle Needle type: Quincke  Needle gauge: 22 G Needle length: 9 cm Assessment Sensory level: T10 Events: CSF return Additional Notes Attempts by CRNA, followed by MD successfully. Negative paresthesia. Negative blood return. Positive free-flowing CSF. Expiration date of kit checked and confirmed. Patient tolerated procedure well, without complications.

## 2020-08-15 NOTE — Anesthesia Procedure Notes (Signed)
Date/Time: 08/15/2020 7:52 AM Performed by: Nelda Marseille, CRNA Pre-anesthesia Checklist: Patient identified, Emergency Drugs available, Suction available, Patient being monitored and Timeout performed Oxygen Delivery Method: Simple face mask

## 2020-08-15 NOTE — Op Note (Signed)
OPERATIVE NOTE  DATE OF SURGERY:  08/15/2020  PATIENT NAME:  Craig Michael   DOB: 1950/04/24  MRN: [id number]  PRE-OPERATIVE DIAGNOSIS: Degenerative arthrosis of the right knee, primary  POST-OPERATIVE DIAGNOSIS:  Same  PROCEDURE:  Right total knee arthroplasty using computer-assisted navigation  SURGEON:  Marciano Sequin. M.D.  ASSISTANT: Cassell Smiles, PA-C (present and scrubbed throughout the case, critical for assistance with exposure, retraction, instrumentation, and closure)  ANESTHESIA: spinal  ESTIMATED BLOOD LOSS: 50 mL  FLUIDS REPLACED: 1400 mL of crystalloid  TOURNIQUET TIME: 94 minutes  DRAINS: 2 medium Hemovac drains  SOFT TISSUE RELEASES: Anterior cruciate ligament, posterior cruciate ligament, deep medial collateral ligament, patellofemoral ligament  IMPLANTS UTILIZED: DePuy Attune size 5 posterior stabilized femoral component (cemented), size 5 rotating platform tibial component (cemented), 38 mm medialized dome patella (cemented), and a 5 mm stabilized rotating platform polyethylene insert.  INDICATIONS FOR SURGERY: Craig Michael is a 70 y.o. year old male with a long history of progressive knee pain. X-rays demonstrated severe degenerative changes in tricompartmental fashion. The patient had not seen any significant improvement despite conservative nonsurgical intervention. After discussion of the risks and benefits of surgical intervention, the patient expressed understanding of the risks benefits and agree with plans for total knee arthroplasty.   The risks, benefits, and alternatives were discussed at length including but not limited to the risks of infection, bleeding, nerve injury, stiffness, blood clots, the need for revision surgery, cardiopulmonary complications, among others, and they were willing to proceed.  PROCEDURE IN DETAIL: The patient was brought into the operating room and, after adequate spinal anesthesia was achieved, a tourniquet was placed  on the patient's upper thigh. The patient's knee and leg were cleaned and prepped with alcohol and DuraPrep and draped in the usual sterile fashion. A "timeout" was performed as per usual protocol. The lower extremity was exsanguinated using an Esmarch, and the tourniquet was inflated to 300 mmHg. An anterior longitudinal incision was made followed by a standard mid vastus approach. The deep fibers of the medial collateral ligament were elevated in a subperiosteal fashion off of the medial flare of the tibia so as to maintain a continuous soft tissue sleeve. The patella was subluxed laterally and the patellofemoral ligament was incised. Inspection of the knee demonstrated severe degenerative changes with full-thickness loss of articular cartilage. Osteophytes were debrided using a rongeur. Anterior and posterior cruciate ligaments were excised. Two 4.0 mm Schanz pins were inserted in the femur and into the tibia for attachment of the array of trackers used for computer-assisted navigation. Hip center was identified using a circumduction technique. Distal landmarks were mapped using the computer. The distal femur and proximal tibia were mapped using the computer. The distal femoral cutting guide was positioned using computer-assisted navigation so as to achieve a 5 distal valgus cut. The femur was sized and it was felt that a size 5 femoral component was appropriate. A size 5 femoral cutting guide was positioned and the anterior cut was performed and verified using the computer. This was followed by completion of the posterior and chamfer cuts. Femoral cutting guide for the central box was then positioned in the center box cut was performed.  Attention was then directed to the proximal tibia. Medial and lateral menisci were excised. The extramedullary tibial cutting guide was positioned using computer-assisted navigation so as to achieve a 0 varus-valgus alignment and 3 posterior slope. The cut was performed and  verified using the computer. The proximal tibia was  sized and it was felt that a size 5 tibial tray was appropriate. Tibial and femoral trials were inserted followed by insertion of a 5 mm polyethylene insert. This allowed for excellent mediolateral soft tissue balancing both in flexion and in full extension. Finally, the patella was cut and prepared so as to accommodate a 38 mm medialized dome patella. A patella trial was placed and the knee was placed through a range of motion with excellent patellar tracking appreciated. The femoral trial was removed after debridement of posterior osteophytes. The central post-hole for the tibial component was reamed followed by insertion of a keel punch. Tibial trials were then removed. Cut surfaces of bone were irrigated with copious amounts of normal saline using pulsatile lavage and then suctioned dry. Polymethylmethacrylate cement was prepared in the usual fashion using a vacuum mixer. Cement was applied to the cut surface of the proximal tibia as well as along the undersurface of a size 5 rotating platform tibial component. Tibial component was positioned and impacted into place. Excess cement was removed using [employer] fast streamer. Cement was then applied to the cut surfaces of the femur as well as along the posterior flanges of the size 5 femoral component. The femoral component was positioned and impacted into place. Excess cement was removed using [employer] fast streamer. A 5 mm polyethylene trial was inserted and the knee was brought into full extension with steady axial compression applied. Finally, cement was applied to the backside of a 38 mm medialized dome patella and the patellar component was positioned and patellar clamp applied. Excess cement was removed using [employer] fast streamer. After adequate curing of the cement, the tourniquet was deflated after a total tourniquet time of 94 minutes. Hemostasis was achieved using electrocautery. The knee was irrigated with copious amounts  of normal saline using pulsatile lavage followed by 500 ml of Surgiphor and then suctioned dry. 20 mL of 1.3% Exparel and 60 mL of 0.25% Marcaine in 40 mL of normal saline was injected along the posterior capsule, medial and lateral gutters, and along the arthrotomy site. A 5 mm stabilized rotating platform polyethylene insert was inserted and the knee was placed through a range of motion with excellent mediolateral soft tissue balancing appreciated and excellent patellar tracking noted. 2 medium drains were placed in the wound bed and brought out through separate stab incisions. The medial parapatellar portion of the incision was reapproximated using interrupted sutures of #1 Vicryl. Subcutaneous tissue was approximated in layers using first #0 Vicryl followed #2-0 Vicryl. The skin was approximated with skin staples. A sterile dressing was applied.  The patient tolerated the procedure well and was transported to the recovery room in stable condition.    Vonnie Spagnolo P. Holley Bouche., M.D.

## 2020-08-15 NOTE — H&P (Signed)
ORTHOPAEDIC HISTORY & PHYSICAL Gwenlyn Fudge, [place] - 08/11/2020 8:15 AM EDT Formatting of this note is different from the original. [facility] Chief Complaint:   Chief Complaint  Patient presents with  . Knee Pain  H & P RIGHT KNEE   History of Present Illness:   Craig Michael is a 69 y.o. male that presents to clinic today for his preoperative history and evaluation. Patient presents unaccompanied. The patient is scheduled to undergo a right total knee arthroplasty on 08/15/20 by Dr. Marry Guan. His pain began several years ago. The pain is located along the medial aspect of the knee. He describes his pain as worse with weightbearing. He reports associated swelling with some giving way of the knee He denies associated numbness or tingling, denies locking.   The patient's symptoms have progressed to the point that they decrease his quality of life. The patient has previously undergone conservative treatment including NSAIDS and injections to the knee without adequate control of his symptoms.  Denies significant cardiac history, history of lumbar surgery, or history of blood clots.  Past Medical, Surgical, Family, Social History, Allergies, Medications:   Past Medical History:  Past Medical History:  Diagnosis Date  . Acute gout involving toe of right foot 11/14/2016  . Arthritis  . Cataract cortical, senile  . Closed rib fracture 11/2017  Left side. s/p fall on scaffolding.  Marland Kitchen GERD (gastroesophageal reflux disease)  . Hypertension  . Impaired glucose tolerance 11/14/2016   Past Surgical History:  Past Surgical History:  Procedure Laterality Date  . CHOLECYSTECTOMY  . 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 HERNIA REPAIR  . Left total knee arthroplasty using computer-assisted navigation 01/26/2019  Dr Marry Guan  . shoulder surgery   Current Medications:  Current Outpatient Medications  Medication Sig  Dispense Refill  . aspirin 81 MG chewable tablet Take 81 mg by mouth once daily  . fluticasone propionate (FLONASE) 50 mcg/actuation nasal spray Place 2 sprays into both nostrils once daily as needed  . lisinopriL (ZESTRIL) 20 MG tablet TAKE 1 TABLET BY MOUTH EVERY DAY 90 tablet 1  . meloxicam (MOBIC) 15 MG tablet TAKE 1 TABLET BY MOUTH EVERY DAY 90 tablet 1  . multivit with minerals/lutein (MULTIVITAMIN 50 PLUS ORAL) Take 1 tablet by mouth once daily  . omeprazole (PRILOSEC) 20 MG DR capsule Take 1 capsule (20 mg total) by mouth once daily 30 capsule 2   No current facility-administered medications for this visit.   Allergies: No Known Allergies  Social History:  Social History   Socioeconomic History  . Marital status: Married  Spouse name: Helene Kelp  . Number of children: 3  . Years of education: 58  Occupational History  . Occupation: Part-time-[employer]  . Smoking status: Former Smoker  Years: 30.00  Types: Cigarettes, Cigars  Quit date: 05/10/2004  Years since quitting: 16.2  . Smokeless tobacco: Never Used  Vaping Use  . Vaping Use: Never used  Substance and Sexual Activity  . Alcohol use: Yes  Alcohol/week: 3.0 standard drinks  Types: 3 Shots of liquor per week  . Drug use: Never  . Sexual activity: Yes  Partners: Female  Birth control/protection: None   Family History:  Family History  Problem Relation Age of Onset  . Arthritis Father  . Diabetes type II Father  . Heart murmur Father  . Diabetes type II Mother  . Diabetes type II Brother   Review  of Systems:   A 10+ ROS was performed, reviewed, and the pertinent orthopaedic findings are documented in the HPI.   Physical Examination:   BP (!) 140/84 (BP Location: Left upper arm, Patient Position: Sitting, BP Cuff Size: Adult)  Ht 167.6 cm (5\' 6" )  Wt 89.8 kg (198 lb)  BMI 31.96 kg/m   Patient is a well-developed, well-nourished male in no acute distress. Patient has normal mood and  affect. Patient is alert and oriented to person, place, and time.   HEENT: Atraumatic, normocephalic. Pupils equal and reactive to light. Extraocular motion intact. Noninjected sclera.  Cardiovascular: Regular rate and rhythm, with no murmurs, rubs, or gallops. Distal pulses palpable.  Respiratory: Lungs clear to auscultation bilaterally.   Right Knee: Soft tissue swelling: mild Effusion: none Erythema: none Crepitance: mild Tenderness: medial Alignment: relative varus Mediolateral laxity: medial pseudolaxity Posterior sag: negative Patellar tracking: Good tracking without evidence of subluxation or tilt Atrophy: No significant atrophy.  Quadriceps tone was good. Range of motion: 0/9/103 degrees   Sensation intact over the saphenous, lateral sural cutaneous, superficial fibular, and deep fibular nerve distributions.  Tests Performed/Reviewed:  X-rays  No new radiographs were obtained today. Previous radiographs were reviewed of the right knee and revealed severe loss of medial compartment joint space with osteophyte formation. No fractures or dislocations.  Impression:   ICD-10-CM  1. Primary osteoarthritis of right knee M17.11   Plan:   The patient has end-stage degenerative changes of the right knee. It was explained to the patient that the condition is progressive in nature. Having failed conservative treatment, the patient has elected to proceed with a total joint arthroplasty. The patient will undergo a total joint arthroplasty with Dr. Marry Guan. The risks of surgery, including blood clot and infection, were discussed with the patient. Measures to reduce these risks, including the use of anticoagulation, perioperative antibiotics, and early ambulation were discussed. The importance of postoperative physical therapy was discussed with the patient. The patient elects to proceed with surgery. The patient is instructed to stop all blood thinners prior to surgery. The patient is  instructed to call the hospital the day before surgery to learn of the proper arrival time.   Contact our office with any questions or concerns. Follow up as indicated, or sooner should any new problems arise, if conditions worsen, or if they are otherwise concerned.   Gwenlyn Fudge, PA-C [facility] [address] [place], [place] [address] Phone: [phone]  This note was generated in part with voice recognition software and I apologize for any typographical errors that were not detected and corrected.  Electronically signed by Gwenlyn Fudge, PA at 08/11/2020 5:32 PM EDT

## 2020-08-15 NOTE — Evaluation (Signed)
Physical Therapy Evaluation Patient Details Name: Craig Michael MRN: [id number] DOB: 12-29-1950 Today's Date: 08/15/2020   History of Present Illness  Pt admitted for R TKR. HIstory of L TKR, arthritis, and GERD. Pt still working part time as Horticulturist, commercial.  Clinical Impression  Pt is a pleasant 70 year old male who was admitted for R TKR. Pt performs bed mobility, transfers, and ambulation with cga and RW. Pt demonstrates ability to perform 10 SLRs with independence, therefore does not require KI for mobility. Pt demonstrates deficits with strength/mobility/pain. Pt is very motivated to participate and eager to go home tomorrow. Would benefit from skilled PT to address above deficits and promote optimal return to PLOF. Recommend transition to [facility] upon discharge from acute hospitalization.     Follow Up Recommendations Home health PT    Equipment Recommendations  None recommended by PT    Recommendations for Other Services       Precautions / Restrictions Precautions Precautions: Fall;Knee Precaution Booklet Issued: No Restrictions Weight Bearing Restrictions: Yes RLE Weight Bearing: Weight bearing as tolerated      Mobility  Bed Mobility Overal bed mobility: Needs Assistance Bed Mobility: Supine to Sit     Supine to sit: Min guard     General bed mobility comments: guidance needed for R LE. Safe technique. ONce seated, able to maintain upright posture. No dizziness noted    Transfers Overall transfer level: Needs assistance Equipment used: Rolling walker (2 wheeled) Transfers: Sit to/from Stand Sit to Stand: Min guard         General transfer comment: safe technique. Good weight acceptance on surgical leg  Ambulation/Gait Ambulation/Gait assistance: Min guard Gait Distance (Feet): 40 Feet Assistive device: Rolling walker (2 wheeled) Gait Pattern/deviations: Step-to pattern     General Gait Details: ambulated in room demonstrating upright posture.Increased  pain with mobility  Stairs            Wheelchair Mobility    Modified Rankin (Stroke Patients Only)       Balance Overall balance assessment: Needs assistance Sitting-balance support: Feet supported Sitting balance-Leahy Scale: Good     Standing balance support: Bilateral upper extremity supported Standing balance-Leahy Scale: Good                               Pertinent Vitals/Pain Pain Assessment: 0-10 Pain Score: 7  Pain Location: R knee Pain Descriptors / Indicators: Operative site guarding Pain Intervention(s): Limited activity within patient's tolerance;Ice applied;Premedicated before session    Home Living Family/patient expects to be discharged to:: Private residence Living Arrangements: Spouse/significant other;Children Available Help at Discharge: Family Type of Home: House Home Access: Stairs to enter Entrance Stairs-Rails: None Entrance Stairs-Number of Steps: 1+1 Home Layout: One level Home Equipment: Environmental consultant - 2 wheels      Prior Function Level of Independence: Independent         Comments: still working part time as Horticulturist, commercial. Very active     Hand Dominance        Extremity/Trunk Assessment   Upper Extremity Assessment Upper Extremity Assessment: Overall WFL for tasks assessed    Lower Extremity Assessment Lower Extremity Assessment: Generalized weakness (R LE grossly 3+/5)       Communication   Communication: No difficulties  Cognition Arousal/Alertness: Awake/alert Behavior During Therapy: WFL for tasks assessed/performed Overall Cognitive Status: Within Functional Limits for tasks assessed  General Comments      Exercises Total Joint Exercises Goniometric ROM: R knee AAROM: 0-80 degrees Other Exercises Other Exercises: Supine ther-ex performed on R knee including AP, QS, SLRs, hip abd/add, and knee flexion. 10 reps with cga.   Assessment/Plan     PT Assessment Patient needs continued PT services  PT Problem List Decreased strength;Decreased range of motion;Decreased mobility;Pain       PT Treatment Interventions DME instruction;Gait training;Stair training;Therapeutic exercise    PT Goals (Current goals can be found in the Care Plan section)  Acute Rehab PT Goals Patient Stated Goal: to go home PT Goal Formulation: With patient Time For Goal Achievement: 08/29/20 Potential to Achieve Goals: Good    Frequency BID   Barriers to discharge        Co-evaluation               AM-PAC PT "6 Clicks" Mobility  Outcome Measure Help needed turning from your back to your side while in a flat bed without using bedrails?: None Help needed moving from lying on your back to sitting on the side of a flat bed without using bedrails?: A Little Help needed moving to and from a bed to a chair (including a wheelchair)?: A Little Help needed standing up from a chair using your arms (e.g., wheelchair or bedside chair)?: A Little Help needed to walk in hospital room?: A Little Help needed climbing 3-5 steps with a railing? : A Little 6 Click Score: 19    End of Session Equipment Utilized During Treatment: Gait belt Activity Tolerance: Patient tolerated treatment well Patient left: in chair;with chair alarm set;with SCD's reapplied Nurse Communication: Mobility status PT Visit Diagnosis: Muscle weakness (generalized) (M62.81);Difficulty in walking, not elsewhere classified (R26.2);Pain Pain - Right/Left: Right Pain - part of body: Knee    Time: 4765-4650 PT Time Calculation (min) (ACUTE ONLY): 28 min   Charges:   PT Evaluation $PT Eval Low Complexity: 1 Low PT Treatments $Therapeutic Exercise: 8-22 mins        Greggory Stallion, PT, DPT [phone]   Elyssia Strausser 08/15/2020, 4:44 PM

## 2020-08-15 NOTE — Transfer of Care (Signed)
Immediate Anesthesia Transfer of Care Note  Patient: Craig Michael  Procedure(s) Performed: COMPUTER ASSISTED TOTAL KNEE ARTHROPLASTY (Right Knee)  Patient Location: PACU  Anesthesia Type:Spinal  Level of Consciousness: awake and sedated  Airway & Oxygen Therapy: Patient Spontanous Breathing and Patient connected to face mask oxygen  Post-op Assessment: Report given to RN and Post -op Vital signs reviewed and stable  Post vital signs: Reviewed and stable  Last Vitals:  Vitals Value Taken Time  BP 116/86 08/15/20 1100  Temp    Pulse 84 08/15/20 1105  Resp 16 08/15/20 1105  SpO2 99 % 08/15/20 1105  Vitals shown include unvalidated device data.  Last Pain:  Vitals:   08/15/20 0621  TempSrc: Tympanic  PainSc: 0-No pain         Complications: No complications documented.

## 2020-08-16 DIAGNOSIS — M1711 Unilateral primary osteoarthritis, right knee: Secondary | ICD-10-CM | POA: Diagnosis not present

## 2020-08-16 MED ORDER — CELECOXIB 200 MG PO CAPS: 200.0000 mg | ORAL_CAPSULE | Freq: Two times a day (BID) | ORAL | 0 refills | Status: AC

## 2020-08-16 MED ORDER — OXYCODONE HCL 5 MG PO TABS: 5.0000 mg | ORAL_TABLET | ORAL | 0 refills | Status: AC | PRN

## 2020-08-16 MED ORDER — ENOXAPARIN SODIUM 40 MG/0.4ML ~~LOC~~ SOLN: 40.0000 mg | SUBCUTANEOUS | 0 refills | Status: AC

## 2020-08-16 NOTE — Plan of Care (Signed)
Problem: Education: Goal: Knowledge of General Education information will improve Description: Including pain rating scale, medication(s)/side effects and non-pharmacologic comfort measures 08/16/2020 1350 by Trula Slade, RN Outcome: Adequate for Discharge 08/16/2020 1013 by Trula Slade, RN Outcome: Progressing 08/16/2020 0805 by Trula Slade, RN Outcome: Progressing   Problem: Health Behavior/Discharge Planning: Goal: Ability to manage health-related needs will improve 08/16/2020 1350 by Trula Slade, RN Outcome: Adequate for Discharge 08/16/2020 1013 by Trula Slade, RN Outcome: Progressing 08/16/2020 0805 by Trula Slade, RN Outcome: Progressing   Problem: Clinical Measurements: Goal: Ability to maintain clinical measurements within normal limits will improve 08/16/2020 1350 by Trula Slade, RN Outcome: Adequate for Discharge 08/16/2020 1013 by Trula Slade, RN Outcome: Progressing 08/16/2020 0805 by Trula Slade, RN Outcome: Progressing Goal: Will remain free from infection 08/16/2020 1350 by Trula Slade, RN Outcome: Adequate for Discharge 08/16/2020 1013 by Trula Slade, RN Outcome: Progressing 08/16/2020 0805 by Trula Slade, RN Outcome: Progressing Goal: Diagnostic test results will improve 08/16/2020 1350 by Trula Slade, RN Outcome: Adequate for Discharge 08/16/2020 1013 by Trula Slade, RN Outcome: Progressing 08/16/2020 0805 by Trula Slade, RN Outcome: Progressing Goal: Respiratory complications will improve 08/16/2020 1350 by Trula Slade, RN Outcome: Adequate for Discharge 08/16/2020 1013 by Trula Slade, RN Outcome: Progressing 08/16/2020 0805 by Trula Slade, RN Outcome: Progressing Goal: Cardiovascular complication will be avoided 08/16/2020 1350 by Trula Slade, RN Outcome: Adequate for Discharge 08/16/2020 1013 by Trula Slade, RN Outcome: Progressing 08/16/2020  0805 by Trula Slade, RN Outcome: Progressing   Problem: Activity: Goal: Risk for activity intolerance will decrease 08/16/2020 1350 by Trula Slade, RN Outcome: Adequate for Discharge 08/16/2020 1013 by Trula Slade, RN Outcome: Progressing 08/16/2020 0805 by Trula Slade, RN Outcome: Progressing   Problem: Nutrition: Goal: Adequate nutrition will be maintained 08/16/2020 1350 by Trula Slade, RN Outcome: Adequate for Discharge 08/16/2020 1013 by Trula Slade, RN Outcome: Progressing 08/16/2020 0805 by Trula Slade, RN Outcome: Progressing   Problem: Elimination: Goal: Will not experience complications related to bowel motility 08/16/2020 1350 by Trula Slade, RN Outcome: Adequate for Discharge 08/16/2020 1013 by Trula Slade, RN Outcome: Progressing 08/16/2020 0805 by Trula Slade, RN Outcome: Progressing Goal: Will not experience complications related to urinary retention 08/16/2020 1350 by Trula Slade, RN Outcome: Adequate for Discharge 08/16/2020 1013 by Trula Slade, RN Outcome: Progressing 08/16/2020 0805 by Trula Slade, RN Outcome: Progressing   Problem: Pain Managment: Goal: General experience of comfort will improve 08/16/2020 1350 by Trula Slade, RN Outcome: Adequate for Discharge 08/16/2020 1013 by Trula Slade, RN Outcome: Progressing 08/16/2020 0805 by Trula Slade, RN Outcome: Progressing   Problem: Education: Goal: Knowledge of the prescribed therapeutic regimen will improve 08/16/2020 1350 by Trula Slade, RN Outcome: Adequate for Discharge 08/16/2020 1013 by Trula Slade, RN Outcome: Progressing 08/16/2020 0805 by Trula Slade, RN Outcome: Progressing Goal: Individualized Educational Video(s) 08/16/2020 1350 by Trula Slade, RN Outcome: Adequate for Discharge 08/16/2020 1013 by Trula Slade, RN Outcome: Progressing 08/16/2020 0805 by Trula Slade,  RN Outcome: Progressing   Problem: Activity: Goal: Ability to avoid complications of mobility impairment will improve 08/16/2020 1350 by Trula Slade, RN Outcome: Adequate for Discharge 08/16/2020 1013 by Trula Slade, RN Outcome: Progressing 08/16/2020 0805 by Trula Slade, RN Outcome: Progressing Goal: Range of  joint motion will improve 08/16/2020 1350 by Trula Slade, RN Outcome: Adequate for Discharge 08/16/2020 1013 by Trula Slade, RN Outcome: Progressing 08/16/2020 0805 by Trula Slade, RN Outcome: Progressing

## 2020-08-16 NOTE — Plan of Care (Signed)
  Problem: Education: Goal: Knowledge of General Education information will improve Description: Including pain rating scale, medication(s)/side effects and non-pharmacologic comfort measures Outcome: Progressing   Problem: Health Behavior/Discharge Planning: Goal: Ability to manage health-related needs will improve Outcome: Progressing   Problem: Clinical Measurements: Goal: Ability to maintain clinical measurements within normal limits will improve Outcome: Progressing Goal: Will remain free from infection Outcome: Progressing Goal: Diagnostic test results will improve Outcome: Progressing Goal: Respiratory complications will improve Outcome: Progressing Goal: Cardiovascular complication will be avoided Outcome: Progressing   Problem: Coping: Goal: Level of anxiety will decrease Outcome: Progressing   Problem: Nutrition: Goal: Adequate nutrition will be maintained Outcome: Progressing   Problem: Elimination: Goal: Will not experience complications related to bowel motility Outcome: Progressing Goal: Will not experience complications related to urinary retention Outcome: Progressing   Problem: Pain Managment: Goal: General experience of comfort will improve Outcome: Progressing   Problem: Safety: Goal: Ability to remain free from injury will improve Outcome: Progressing   Problem: Education: Goal: Knowledge of the prescribed therapeutic regimen will improve Outcome: Progressing Goal: Individualized Educational Video(s) Outcome: Progressing   Problem: Clinical Measurements: Goal: Postoperative complications will be avoided or minimized Outcome: Progressing   Problem: Skin Integrity: Goal: Will show signs of wound healing Outcome: Progressing

## 2020-08-16 NOTE — Progress Notes (Signed)
  Subjective: 1 Day Post-Op Procedure(s) (LRB): COMPUTER ASSISTED TOTAL KNEE ARTHROPLASTY (Right) Patient reports pain as well-controlled.   Patient is well, and has had no acute complaints or problems Plan is to go Home after hospital stay. Negative for chest pain and shortness of breath Fever: no Gastrointestinal: negative for nausea and vomiting.   Patient has not had a bowel movement.  Objective: Vital signs in last 24 hours: Temp:  [97.6 F (36.4 C)-98.7 F (37.1 C)] 98.1 F (36.7 C) (04/26 0803) Pulse Rate:  [62-92] 71 (04/26 0803) Resp:  [11-20] 16 (04/26 0803) BP: (116-159)/(81-94) 159/84 (04/26 0803) SpO2:  [97 %-100 %] 98 % (04/26 0803)  Intake/Output from previous day:  Intake/Output Summary (Last 24 hours) at 08/16/2020 0825 Last data filed at 08/16/2020 0700 Gross per 24 hour  Intake 3500.22 ml  Output 2895 ml  Net 605.22 ml    Intake/Output this shift: No intake/output data recorded.  Labs: No results for input(s): HGB in the last 72 hours. No results for input(s): WBC, RBC, HCT, PLT in the last 72 hours. No results for input(s): NA, K, CL, CO2, BUN, CREATININE, GLUCOSE, CALCIUM in the last 72 hours. No results for input(s): LABPT, INR in the last 72 hours.   EXAM General - Patient is Alert, Appropriate and Oriented Extremity - Neurovascular intact Dorsiflexion/Plantar flexion intact Compartment soft Dressing/Incision -Postoperative dressing remains in place., Polar Care in place and working. , Hemovac in place.  Motor Function - intact, moving foot and toes well on exam.  Cardiovascular- Regular rate and rhythm, no murmurs/rubs/gallops Respiratory- faint crackles heard in left lower lobe, otherwise clear \ Gastrointestinal- soft, nontender and active bowel sounds   Assessment/Plan: 1 Day Post-Op Procedure(s) (LRB): COMPUTER ASSISTED TOTAL KNEE ARTHROPLASTY (Right) Active Problems:   Total knee replacement status  Estimated body mass index is  31.47 kg/m as calculated from the following:   Height as of this encounter: 5\' 6"  (1.676 m).   Weight as of this encounter: 88.5 kg. Advance diet Up with therapy  Possible d/c this PM pending PT progress.  DVT Prophylaxis - Lovenox, Ted hose and foot pumps Weight-Bearing as tolerated to right leg  Cassell Smiles, PA-C [facility] 08/16/2020, 8:25 AM

## 2020-08-16 NOTE — Progress Notes (Signed)
Physical Therapy Treatment Patient Details Name: Craig Michael Reason MRN: [id number] DOB: February 10, 1951 Today's Date: 08/16/2020    History of Present Illness Pt admitted for R TKR. HIstory of L TKR, arthritis, and GERD. Pt still working part time as Horticulturist, commercial.    PT Comments    Pt is making good progress towards goals with ability to perform stair training, HEP, and gait training successfully. Pt is safe and eager to go home. Good endurance with AAROM. Care team notified.   Follow Up Recommendations  Home health PT     Equipment Recommendations  None recommended by PT    Recommendations for Other Services       Precautions / Restrictions Precautions Precautions: Fall;Knee Precaution Booklet Issued: Yes (comment) Restrictions Weight Bearing Restrictions: Yes RLE Weight Bearing: Weight bearing as tolerated    Mobility  Bed Mobility Overal bed mobility: Needs Assistance Bed Mobility: Supine to Sit     Supine to sit: Supervision     General bed mobility comments: OOB on arrival. Ambulating with PT back to room    Transfers Overall transfer level: Needs assistance Equipment used: Rolling walker (2 wheeled) Transfers: Sit to/from [facility] Sit to Stand: Supervision Stand pivot transfers: Supervision       General transfer comment: safe technique  Ambulation/Gait Ambulation/Gait assistance: Min guard Gait Distance (Feet): 200 Feet Assistive device: Rolling walker (2 wheeled) Gait Pattern/deviations: Step-through pattern     General Gait Details: improved gait technique with good weight acceptance on surgical LE. Reciprocal gait pattern performed. Upright posture   Stairs Stairs: Yes Stairs assistance: Min guard Stair Management: No rails Number of Stairs: 1 General stair comments: safe technique with use of RW. Pt familiar from previous surgery.   Wheelchair Mobility    Modified Rankin (Stroke Patients Only)       Balance Overall  balance assessment: Needs assistance Sitting-balance support: Feet supported Sitting balance-Leahy Scale: Good     Standing balance support: Bilateral upper extremity supported Standing balance-Leahy Scale: Good                              Cognition Arousal/Alertness: Awake/alert Behavior During Therapy: WFL for tasks assessed/performed Overall Cognitive Status: Within Functional Limits for tasks assessed                                        Exercises Total Joint Exercises Goniometric ROM: R knee AAROM: 0-91 degrees Other Exercises Other Exercises: Supine ther-ex performed on R knee including AP, QS, SLRs, SAQ, LAQ, hip abd/add, and knee flexion stretches. 12 reps with cga. Other Exercises: written HEP given and reviewed. All questions addressed with education    General Comments        Pertinent Vitals/Pain Pain Assessment: 0-10 Pain Score: 3  Pain Location: R knee Pain Descriptors / Indicators: Operative site guarding Pain Intervention(s): Limited activity within patient's tolerance;Monitored during session;Repositioned    Home Living Family/patient expects to be discharged to:: Private residence Living Arrangements: Spouse/significant other Available Help at Discharge: Family;Available 24 hours/day Type of Home: House Home Access: Stairs to enter Entrance Stairs-Rails: None Home Layout: One level Home Equipment: Environmental consultant - 2 wheels      Prior Function Level of Independence: Independent      Comments: still working part time as Horticulturist, commercial. Very active   PT Goals (current goals  can now be found in the care plan section) Acute Rehab PT Goals Patient Stated Goal: to go home PT Goal Formulation: With patient Time For Goal Achievement: 08/29/20 Potential to Achieve Goals: Good Progress towards PT goals: Progressing toward goals    Frequency    BID      PT Plan Current plan remains appropriate    Co-evaluation               AM-PAC PT "6 Clicks" Mobility   Outcome Measure  Help needed turning from your back to your side while in a flat bed without using bedrails?: None Help needed moving from lying on your back to sitting on the side of a flat bed without using bedrails?: None Help needed moving to and from a bed to a chair (including a wheelchair)?: None Help needed standing up from a chair using your arms (e.g., wheelchair or bedside chair)?: None Help needed to walk in hospital room?: None Help needed climbing 3-5 steps with a railing? : None 6 Click Score: 24    End of Session Equipment Utilized During Treatment: Gait belt Activity Tolerance: Patient tolerated treatment well Patient left: in chair (left with OT in room) Nurse Communication: Mobility status PT Visit Diagnosis: Muscle weakness (generalized) (M62.81);Difficulty in walking, not elsewhere classified (R26.2);Pain Pain - Right/Left: Right Pain - part of body: Knee     Time: 7824-2353 PT Time Calculation (min) (ACUTE ONLY): 28 min  Charges:  $Gait Training: 8-22 mins $Therapeutic Exercise: 8-22 mins                     Greggory Stallion, PT, DPT [phone]    Indianna Boran 08/16/2020, 10:58 AM

## 2020-08-16 NOTE — Evaluation (Signed)
Occupational Therapy Evaluation Patient Details Name: Craig Michael MRN: [id number] DOB: 11/12/1950 Today's Date: 08/16/2020    History of Present Illness Pt admitted for R TKR. HIstory of L TKR, arthritis, and GERD. Pt still working part time as Horticulturist, commercial.   Clinical Impression   Pt transitioned easily from PT session with 3/10 c/o pain in R knee. Pt is very motivated to go home today. Pt reports living at home with wife independently at baseline. Pt works as a Horticulturist, commercial part time. Today pt ambulates with RW and demonstrated functional transfers and self care tasks at supervision overall. Pt is able to reach R toes to demonstrate dressing needs but reports also having [place] and wife to assist with LB dressing if needed at discharge. Pt plans to sink bath for a few weeks until feeling safety to get into shower. Pt does not need any additional equipment at this time. OT provided paper handout and educated pt on use of polar care. He did remember some of this education from previous surgery. Pt with no skilled OT need at this time. OT to SIGN OFF. Thank you for this referral.     Follow Up Recommendations  No OT follow up;Supervision - Intermittent    Equipment Recommendations  None recommended by OT       Precautions / Restrictions Precautions Precautions: Fall;Knee Precaution Booklet Issued: Yes (comment) Restrictions Weight Bearing Restrictions: Yes RLE Weight Bearing: Weight bearing as tolerated      Mobility Bed Mobility Overal bed mobility: Needs Assistance Bed Mobility: Supine to Sit     Supine to sit: Supervision     General bed mobility comments: OOB on arrival. Ambulating with PT back to room    Transfers Overall transfer level: Needs assistance Equipment used: Rolling walker (2 wheeled) Transfers: Sit to/from [facility] Sit to Stand: Supervision Stand pivot transfers: Supervision       General transfer comment: safe technique     Balance Overall balance assessment: Needs assistance Sitting-balance support: Feet supported Sitting balance-Leahy Scale: Good     Standing balance support: Bilateral upper extremity supported Standing balance-Leahy Scale: Good                             ADL either performed or assessed with clinical judgement   ADL Overall ADL's : Needs assistance/impaired     Grooming: Wash/dry hands;Wash/dry face;Standing;Supervision/safety               Lower Body Dressing: Minimal assistance;Sit to/from stand   Toilet Transfer: Supervision/safety;RW   Toileting- Water quality scientist and Hygiene: Supervision/safety;Sit to/from stand       Functional mobility during ADLs: Supervision/safety;Rolling walker       Vision Patient Visual Report: No change from baseline              Pertinent Vitals/Pain Pain Assessment: 0-10 Pain Score: 3  Pain Location: R knee Pain Descriptors / Indicators: Operative site guarding Pain Intervention(s): Limited activity within patient's tolerance;Monitored during session;Repositioned     Hand Dominance Right   Extremity/Trunk Assessment Upper Extremity Assessment Upper Extremity Assessment: Overall WFL for tasks assessed   Lower Extremity Assessment Lower Extremity Assessment: Generalized weakness       Communication Communication Communication: No difficulties   Cognition Arousal/Alertness: Awake/alert Behavior During Therapy: WFL for tasks assessed/performed Overall Cognitive Status: Within Functional Limits for tasks assessed  Exercises Total Joint Exercises Goniometric ROM: R knee AAROM: 0-91 degrees Other Exercises Other Exercises: Supine ther-ex performed on R knee including AP, QS, SLRs, SAQ, LAQ, hip abd/add, and knee flexion stretches. 12 reps with cga. Other Exercises: written HEP given and reviewed. 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 Very active                 OT Goals(Current goals can be found in the care plan section) Acute Rehab OT Goals Patient Stated Goal: to go home OT Goal Formulation: With patient Potential to Achieve Goals: Good  OT Frequency:      AM-PAC OT "6 Clicks" Daily Activity     Outcome Measure Help from another person eating meals?: None Help from another person taking care of personal grooming?: None Help from another person toileting, which includes using toliet, bedpan, or urinal?: A Little Help from another person bathing (including washing, rinsing, drying)?: A Little Help from another person to put on and taking off regular upper body clothing?: None Help from another person to put on and taking off regular lower body clothing?: A Little 6 Click Score: 21   End of Session Equipment Utilized During Treatment: Rolling walker Nurse Communication: Mobility status  Activity Tolerance: Patient tolerated treatment well Patient left: in chair;with call bell/phone within reach;with chair alarm set                   Time: [phone] OT Time Calculation (min): 21 min Charges:  OT General Charges $OT Visit: 1 Visit OT Evaluation $OT Eval Low Complexity: 1 Low OT Treatments $Self Care/Home Management : 8-22 mins  Darleen Crocker, MS, OTR/L , CBIS ascom [phone]  08/16/20, 11:01 AM

## 2020-08-16 NOTE — Anesthesia Postprocedure Evaluation (Signed)
Anesthesia Post Note  Patient: Craig Michael  Procedure(s) Performed: COMPUTER ASSISTED TOTAL KNEE ARTHROPLASTY (Right Knee)  Patient location during evaluation: PACU Anesthesia Type: Combined General/Spinal Level of consciousness: oriented and awake and alert Pain management: pain level controlled Vital Signs Assessment: post-procedure vital signs reviewed and stable Respiratory status: spontaneous breathing, respiratory function stable and patient connected to nasal cannula oxygen Cardiovascular status: blood pressure returned to baseline and stable Postop Assessment: no headache, no backache and no apparent nausea or vomiting Anesthetic complications: no   No complications documented.   Last Vitals:  Vitals:   08/15/20 1954 08/16/20 0459  BP: 132/81 (!) 146/87  Pulse: 88 71  Resp: 17 16  Temp: 36.7 C 36.9 C  SpO2: 97% 97%    Last Pain:  Vitals:   08/16/20 0459  TempSrc: Oral  PainSc:                  Arita Miss

## 2020-08-16 NOTE — Plan of Care (Signed)
  Problem: Education: Goal: Knowledge of General Education information will improve Description: Including pain rating scale, medication(s)/side effects and non-pharmacologic comfort measures 08/16/2020 1013 by Trula Slade, RN Outcome: Progressing 08/16/2020 0805 by Trula Slade, RN Outcome: Progressing   Problem: Health Behavior/Discharge Planning: Goal: Ability to manage health-related needs will improve 08/16/2020 1013 by Trula Slade, RN Outcome: Progressing 08/16/2020 0805 by Trula Slade, RN Outcome: Progressing   Problem: Clinical Measurements: Goal: Ability to maintain clinical measurements within normal limits will improve 08/16/2020 1013 by Trula Slade, RN Outcome: Progressing 08/16/2020 0805 by Trula Slade, RN Outcome: Progressing Goal: Will remain free from infection 08/16/2020 1013 by Trula Slade, RN Outcome: Progressing 08/16/2020 0805 by Trula Slade, RN Outcome: Progressing Goal: Diagnostic test results will improve 08/16/2020 1013 by Trula Slade, RN Outcome: Progressing 08/16/2020 0805 by Trula Slade, RN Outcome: Progressing Goal: Respiratory complications will improve 08/16/2020 1013 by Trula Slade, RN Outcome: Progressing 08/16/2020 0805 by Trula Slade, RN Outcome: Progressing Goal: Cardiovascular complication will be avoided 08/16/2020 1013 by Trula Slade, RN Outcome: Progressing 08/16/2020 0805 by Trula Slade, RN Outcome: Progressing   Problem: Nutrition: Goal: Adequate nutrition will be maintained 08/16/2020 1013 by Trula Slade, RN Outcome: Progressing 08/16/2020 0805 by Trula Slade, RN Outcome: Progressing   Problem: Coping: Goal: Level of anxiety will decrease 08/16/2020 1013 by Trula Slade, RN Outcome: Progressing 08/16/2020 0805 by Trula Slade, RN Outcome: Progressing   Problem: Elimination: Goal: Will not experience complications related to bowel  motility 08/16/2020 1013 by Trula Slade, RN Outcome: Progressing 08/16/2020 0805 by Trula Slade, RN Outcome: Progressing Goal: Will not experience complications related to urinary retention 08/16/2020 1013 by Trula Slade, RN Outcome: Progressing 08/16/2020 0805 by Trula Slade, RN Outcome: Progressing   Problem: Pain Managment: Goal: General experience of comfort will improve 08/16/2020 1013 by Trula Slade, RN Outcome: Progressing 08/16/2020 0805 by Trula Slade, RN Outcome: Progressing   Problem: Safety: Goal: Ability to remain free from injury will improve 08/16/2020 1013 by Trula Slade, RN Outcome: Progressing 08/16/2020 0805 by Trula Slade, RN Outcome: Progressing

## 2020-08-16 NOTE — Discharge Summary (Signed)
Physician Discharge Summary  Patient ID: Craig Michael MRN: [id number] DOB/AGE: 68-Jun-1952 68 y.o.  Admit date: 08/15/2020 Discharge date: 08/16/2020  Admission Diagnoses:  Total knee replacement status [Z96.659]  Surgeries:Procedure(s):  Right total knee arthroplasty using computer-assisted navigation  SURGEON:  Marciano Sequin. M.D.  ASSISTANT: Cassell Smiles, PA-C (present and scrubbed throughout the case, critical for assistance with exposure, retraction, instrumentation, and closure)  ANESTHESIA: spinal  ESTIMATED BLOOD LOSS: 50 mL  FLUIDS REPLACED: 1400 mL of crystalloid  TOURNIQUET TIME: 94 minutes  DRAINS: 2 medium Hemovac drains  SOFT TISSUE RELEASES: Anterior cruciate ligament, posterior cruciate ligament, deep medial collateral ligament, patellofemoral ligament  IMPLANTS UTILIZED: DePuy Attune size 5 posterior stabilized femoral component (cemented), size 5 rotating platform tibial component (cemented), 38 mm medialized dome patella (cemented), and a 5 mm stabilized rotating platform polyethylene insert.  Discharge Diagnoses: Patient Active Problem List   Diagnosis Date Noted  . Total knee replacement status 08/15/2020  . Status post total left knee replacement 01/26/2019  . Primary osteoarthritis of right knee 04/02/2018  . Acute gout involving toe of right foot 11/14/2016  . Impaired glucose tolerance 11/14/2016  . Essential hypertension 02/15/2016  . Gastroesophageal reflux disease without esophagitis 09/09/2015    Past Medical History:  Diagnosis Date  . Arthritis   . GERD (gastroesophageal reflux disease)   . Hypertension      Transfusion:    Consultants (if any):   Discharged Condition: Improved  Hospital Course: Craig Michael is an 70 y.o. male who was admitted 08/15/2020 with a diagnosis of right knee osteoarthritis and went to the operating room on 08/15/2020 and underwent right total knee arthroplasty. The patient received  perioperative antibiotics for prophylaxis (see below). The patient tolerated the procedure well and was transported to PACU in stable condition. After meeting PACU criteria, the patient was subsequently transferred to the Orthopaedics/Rehabilitation unit.   The patient received DVT prophylaxis in the form of early mobilization, Lovenox, Foot Pumps and TED hose. A sacral pad had been placed and heels were elevated off of the bed with rolled towels in order to protect skin integrity. Foley catheter was discontinued on postoperative day #0. Wound drains were discontinued on postoperative day #1. The surgical incision was healing well without signs of infection.  Physical therapy was initiated postoperatively for transfers, gait training, and strengthening. Occupational therapy was initiated for activities of daily living and evaluation for assisted devices. Rehabilitation goals were reviewed in detail with the patient. The patient made steady progress with physical therapy and physical therapy recommended discharge to Home.   The patient achieved the preliminary goals of this hospitalization and was felt to be medically and orthopaedically appropriate for discharge.  He was given perioperative antibiotics:  Anti-infectives (From admission, onward)   Start     Dose/Rate Route Frequency Ordered Stop   08/15/20 1330  ceFAZolin (ANCEF) IVPB 2g/100 mL premix        2 g 200 mL/hr over 30 Minutes Intravenous Every 6 hours 08/15/20 1110 08/15/20 2057   08/15/20 0600  ceFAZolin (ANCEF) IVPB 2g/100 mL premix        2 g 200 mL/hr over 30 Minutes Intravenous On call to O.R. 08/15/20 0110 08/15/20 0756    .  Recent vital signs:  Vitals:   08/16/20 0803 08/16/20 1159  BP: (!) 159/84 (!) 152/81  Pulse: 71 73  Resp: 16 16  Temp: 98.1 F (36.7 C) 98.1 F (36.7 C)  SpO2: 98% 95%  Recent laboratory studies:  No results for input(s): WBC, HGB, HCT, PLT, K, CL, CO2, BUN, CREATININE, GLUCOSE, CALCIUM,  LABPT, INR in the last 72 hours.  Diagnostic Studies: DG Knee Right Port  Result Date: 08/15/2020 CLINICAL DATA:  Postop right knee arthroplasty. EXAM: PORTABLE RIGHT KNEE - 1-2 VIEW COMPARISON:  None. FINDINGS: Right total knee arthroplasty. Six surgical skin staples and drains are in place. Subcutaneous and joint air/fluid noted. External fixator holes are seen in the distal femur and proximal tibia. IMPRESSION: Right total knee arthroplasty with expected postoperative findings. Electronically Signed   By: Lorin Picket M.D.   On: 08/15/2020 11:31    Discharge Medications:   Allergies as of 08/16/2020   No Known Allergies     Medication List    STOP taking these medications   meloxicam 15 MG tablet Commonly known as: MOBIC     TAKE these medications   celecoxib 200 MG capsule Commonly known as: CELEBREX Take 1 capsule (200 mg total) by mouth 2 (two) times daily.   enoxaparin 40 MG/0.4ML injection Commonly known as: LOVENOX Inject 0.4 mLs (40 mg total) into the skin daily for 14 days.   lisinopril 20 MG tablet Commonly known as: ZESTRIL Take 20 mg by mouth daily.   multivitamin with minerals Tabs tablet Take 1 tablet by mouth daily.   omeprazole 20 MG capsule Commonly known as: PRILOSEC Take 20 mg by mouth daily.   omeprazole 40 MG capsule Commonly known as: PRILOSEC Take 40 mg by mouth in the morning.   OVER THE COUNTER MEDICATION Take 1 capsule by mouth daily. Prostate and Bladder Control   oxyCODONE 5 MG immediate release tablet Commonly known as: Oxy IR/ROXICODONE Take 1 tablet (5 mg total) by mouth every 4 (four) hours as needed for moderate pain (pain score 4-6).            Durable Medical Equipment  (From admission, onward)         Start     Ordered   08/15/20 1308  DME Walker rolling  Once       Question:  Patient needs a walker to treat with the following condition  Answer:  Total knee replacement status   08/15/20 1307   08/15/20 1308  DME  Bedside commode  Once       Question:  Patient needs a bedside commode to treat with the following condition  Answer:  Total knee replacement status   08/15/20 1307          Disposition: Home with home health PT     Follow-up Information    [facility] On 08/30/2020.   Specialty: Orthopedic Surgery Why: at 9:45am Contact information: [place] [address] [phone]        Dereck Leep, MD On 10/04/2020.   Specialty: Orthopedic Surgery Why: at 2:15pm Contact information: [place] [address] [place], PA-C 08/16/2020, 12:39 PM

## 2021-10-12 IMAGING — DX DG KNEE 1-2V PORT*R*
2 series · 2 of 2 positions shown · non-contrast
Comparison: None.

CLINICAL DATA: Postop right knee arthroplasty.

EXAM:
PORTABLE RIGHT KNEE - 1-2 VIEW

[knee ap]
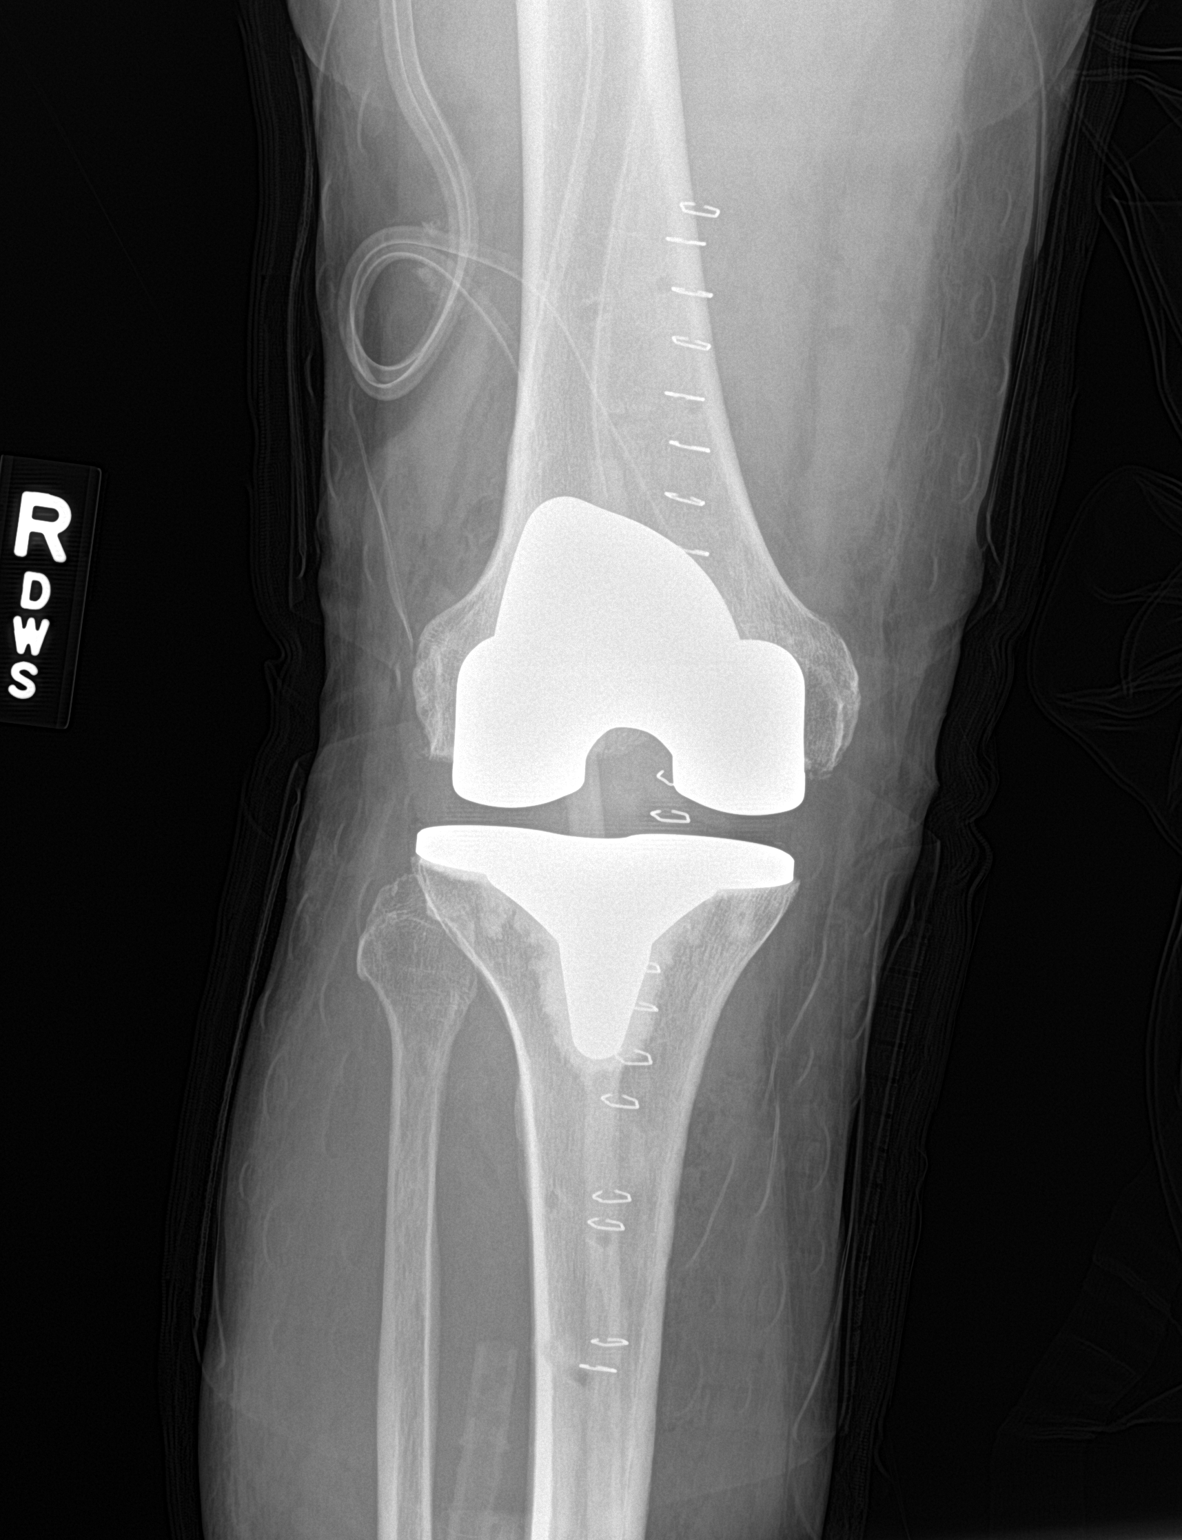

[knee lat]
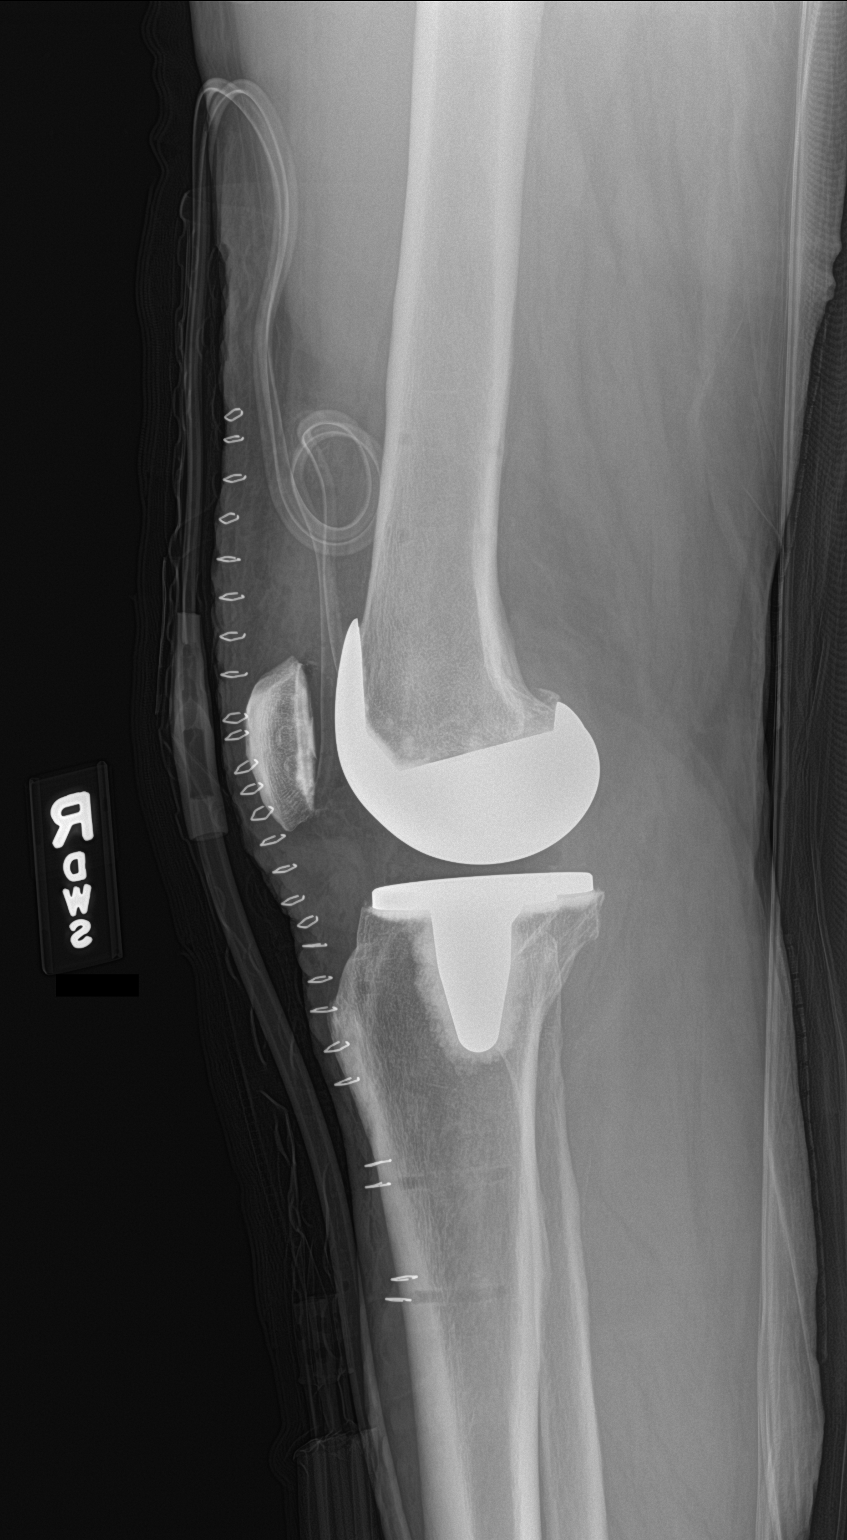

[2 of 2 positions shown; findings below may reference images not displayed]

FINDINGS: Right total knee arthroplasty. Six surgical skin staples and drains
are in place. Subcutaneous and joint air/fluid noted. External
fixator holes are seen in the distal femur and proximal tibia.
IMPRESSION: Right total knee arthroplasty with expected postoperative findings.

## 2022-05-16 ENCOUNTER — Encounter: Payer: Self-pay | Admitting: Gastroenterology

## 2022-05-17 ENCOUNTER — Encounter
Admission: RE | Disposition: A | Discharge status: Home / Self Care | Payer: Self-pay | Source: Ambulatory Visit | Attending: Gastroenterology

## 2022-05-17 ENCOUNTER — Ambulatory Visit: Payer: Medicare HMO | Admitting: Certified Registered"

## 2022-05-17 ENCOUNTER — Encounter: Payer: Self-pay | Admitting: Gastroenterology

## 2022-05-17 ENCOUNTER — Ambulatory Visit
Admission: RE | Admit: 2022-05-17 | Discharge: 2022-05-17 | Disposition: A | Discharge status: Home / Self Care | Payer: Medicare HMO | Source: Ambulatory Visit | Attending: Gastroenterology | Admitting: Gastroenterology

## 2022-05-17 DIAGNOSIS — Z1211 Encounter for screening for malignant neoplasm of colon: Secondary | ICD-10-CM | POA: Insufficient documentation

## 2022-05-17 DIAGNOSIS — K219 Gastro-esophageal reflux disease without esophagitis: Secondary | ICD-10-CM | POA: Insufficient documentation

## 2022-05-17 DIAGNOSIS — I10 Essential (primary) hypertension: Secondary | ICD-10-CM | POA: Diagnosis not present

## 2022-05-17 DIAGNOSIS — K449 Diaphragmatic hernia without obstruction or gangrene: Secondary | ICD-10-CM | POA: Diagnosis not present

## 2022-05-17 DIAGNOSIS — D123 Benign neoplasm of transverse colon: Secondary | ICD-10-CM | POA: Diagnosis not present

## 2022-05-17 DIAGNOSIS — K5730 Diverticulosis of large intestine without perforation or abscess without bleeding: Secondary | ICD-10-CM | POA: Diagnosis not present

## 2022-05-17 DIAGNOSIS — Z79899 Other long term (current) drug therapy: Secondary | ICD-10-CM | POA: Diagnosis not present

## 2022-05-17 DIAGNOSIS — Z9049 Acquired absence of other specified parts of digestive tract: Secondary | ICD-10-CM | POA: Insufficient documentation

## 2022-05-17 DIAGNOSIS — K640 First degree hemorrhoids: Secondary | ICD-10-CM | POA: Insufficient documentation

## 2022-05-17 DIAGNOSIS — Z87891 Personal history of nicotine dependence: Secondary | ICD-10-CM | POA: Diagnosis not present

## 2022-05-17 DIAGNOSIS — K621 Rectal polyp: Secondary | ICD-10-CM | POA: Diagnosis not present

## 2022-05-17 HISTORY — PX: ESOPHAGOGASTRODUODENOSCOPY: SHX5428

## 2022-05-17 HISTORY — PX: COLONOSCOPY: SHX5424

## 2022-05-17 SURGERY — COLONOSCOPY
Anesthesia: General

## 2022-05-17 MED ORDER — SODIUM CHLORIDE 0.9 % IV SOLN
INTRAVENOUS | Status: DC
  Administered 2022-05-17: 1000 mL via INTRAVENOUS

## 2022-05-17 MED ORDER — PROPOFOL 10 MG/ML IV BOLUS
INTRAVENOUS | Status: AC
  Filled 2022-05-17: qty 40

## 2022-05-17 MED ORDER — PROPOFOL 10 MG/ML IV BOLUS
INTRAVENOUS | Status: DC | PRN
  Administered 2022-05-17: 130 ug/kg/min via INTRAVENOUS
  Administered 2022-05-17: 120 mg via INTRAVENOUS

## 2022-05-17 MED ORDER — LIDOCAINE HCL (CARDIAC) PF 100 MG/5ML IV SOSY
PREFILLED_SYRINGE | INTRAVENOUS | Status: DC | PRN
  Administered 2022-05-17: 100 mg via INTRAVENOUS

## 2022-05-17 MED ORDER — LIDOCAINE HCL (PF) 2 % IJ SOLN
INTRAMUSCULAR | Status: AC
  Filled 2022-05-17: qty 5

## 2022-05-17 NOTE — Anesthesia Postprocedure Evaluation (Signed)
Anesthesia Post Note  Patient: Craig Michael  Procedure(s) Performed: COLONOSCOPY ESOPHAGOGASTRODUODENOSCOPY (EGD)  Patient location during evaluation: Endoscopy Anesthesia Type: General Level of consciousness: awake and alert Pain management: pain level controlled Vital Signs Assessment: post-procedure vital signs reviewed and stable Respiratory status: spontaneous breathing, nonlabored ventilation, respiratory function stable and patient connected to nasal cannula oxygen Cardiovascular status: blood pressure returned to baseline and stable Postop Assessment: no apparent nausea or vomiting Anesthetic complications: no  No notable events documented.   Last Vitals:  Vitals:   05/17/22 0805 05/17/22 0911  BP: (!) 147/98 (!) 99/54  Pulse: (!) 102 72  Resp: 18 18  Temp: (!) 36 C (!) 35.9 C  SpO2: 97% 98%    Last Pain:  Vitals:   05/17/22 0911  TempSrc:   PainSc: 0-No pain                 Dimas Millin

## 2022-05-17 NOTE — H&P (Signed)
Pre-Procedure H&P   Patient ID: Craig Michael is a 72 y.o. male.  Gastroenterology Provider: Annamaria Helling, DO  Referring Provider: Dawson Bills, NP PCP: Sallee Lange, NP  Date: 05/17/2022  HPI Mr. Craig Michael is a 72 y.o. male who presents today for Esophagogastroduodenoscopy and Colonoscopy for GERD; surveillance-personal history of colon polyps .  Pt with phx polyps; last colonoscopy performed January 2018 with sigmoid diverticulosis and internal hemorrhoids and 1 sessile serrated polyp.  Also had additional hyperplastic polyps  No longer taking meloxicam daily  Bowel movements otherwise moving regularly without melena or hematochezia.  Patient has had longstanding reflux on PPI.  He notes frequent throat clearing.  No odynophagia.  He notes occasional difficulty with large pills and peanuts to have the skin on them tend to stick and cause more coughing.  No abdominal pain.  No issues with pills or liquids.  Reflux overall controlled on omeprazole daily.  Previous tobacco use.  Status post-cholecystectomy  Hemoglobin 14.8 MCV 92.5 platelets 193,000   Past Medical History:  Diagnosis Date   Arthritis    GERD (gastroesophageal reflux disease)    Hypertension     Past Surgical History:  Procedure Laterality Date   CHOLECYSTECTOMY N/A 05/24/2015   Procedure: LAPAROSCOPIC CHOLECYSTECTOMY WITH INTRAOPERATIVE CHOLANGIOGRAM;  Surgeon: Christene Lye, MD;  Location: [facility] ORS;  Service: General;  Laterality: N/A;   HERNIA REPAIR     JOINT REPLACEMENT     KNEE ARTHROPLASTY Left 01/26/2019   Procedure: COMPUTER ASSISTED TOTAL KNEE ARTHROPLASTY   RNFA;  Surgeon: Dereck Leep, MD;  Location: [facility] ORS;  Service: Orthopedics;  Laterality: Left;   KNEE ARTHROPLASTY Right 08/15/2020   Procedure: COMPUTER ASSISTED TOTAL KNEE ARTHROPLASTY;  Surgeon: Dereck Leep, MD;  Location: [facility] ORS;  Service: Orthopedics;  Laterality: Right;   SHOULDER SURGERY  Right 16/10/3708   UMBILICAL HERNIA REPAIR N/A 05/24/2015   Procedure: HERNIA REPAIR UMBILICAL ADULT;  Surgeon: Christene Lye, MD;  Location: [facility] ORS;  Service: General;  Laterality: N/A;    Family History No h/o GI disease or malignancy  Review of Systems  Constitutional:  Negative for activity change, appetite change, chills, diaphoresis, fatigue, fever and unexpected weight change.  HENT:  Negative for trouble swallowing and voice change.   Respiratory:  Positive for cough (throat clearing, chronic). Negative for shortness of breath and wheezing.   Cardiovascular:  Negative for chest pain, palpitations and leg swelling.  Gastrointestinal:  Negative for abdominal distention, abdominal pain, anal bleeding, blood in stool, constipation, diarrhea, nausea and vomiting.  Musculoskeletal:  Negative for arthralgias and myalgias.  Skin:  Negative for color change and pallor.  Neurological:  Negative for dizziness, syncope and weakness.  Psychiatric/Behavioral:  Negative for confusion. The patient is not nervous/anxious.   All other systems reviewed and are negative.    Medications No current facility-administered medications on file prior to encounter.   Current Outpatient Medications on File Prior to Encounter  Medication Sig Dispense Refill   lisinopril (ZESTRIL) 20 MG tablet Take 20 mg by mouth daily.     Multiple Vitamin (MULTIVITAMIN WITH MINERALS) TABS tablet Take 1 tablet by mouth daily.     omeprazole (PRILOSEC) 20 MG capsule Take 20 mg by mouth daily.     OVER THE COUNTER MEDICATION Take 1 capsule by mouth daily. Prostate and Bladder Control     celecoxib (CELEBREX) 200 MG capsule Take 1 capsule (200 mg total) by mouth 2 (two) times daily. (Patient  not taking: Reported on 05/17/2022) 90 capsule 0   enoxaparin (LOVENOX) 40 MG/0.4ML injection Inject 0.4 mLs (40 mg total) into the skin daily for 14 days. 5.6 mL 0   omeprazole (PRILOSEC) 40 MG capsule Take 40 mg by mouth in the  morning.     oxyCODONE (OXY IR/ROXICODONE) 5 MG immediate release tablet Take 1 tablet (5 mg total) by mouth every 4 (four) hours as needed for moderate pain (pain score 4-6). (Patient not taking: Reported on 05/17/2022) 30 tablet 0    Pertinent medications related to GI and procedure were reviewed by me with the patient prior to the procedure   Current Facility-Administered Medications:    0.9 %  sodium chloride infusion, , Intravenous, Continuous, Annamaria Helling, DO, Last Rate: 20 mL/hr at 05/17/22 0804, 1,000 mL at 05/17/22 0804      No Known Allergies Allergies were reviewed by me prior to the procedure  Objective   Body mass index is 31.54 kg/m. Vitals:   05/17/22 0805  BP: (!) 147/98  Pulse: (!) 102  Resp: 18  Temp: (!) 96.8 F (36 C)  TempSrc: Temporal  SpO2: 97%  Weight: 88.6 kg  Height: '5\' 6"'$  (1.676 m)     Physical Exam Vitals and nursing note reviewed.  Constitutional:      General: He is not in acute distress.    Appearance: Normal appearance. He is obese. He is not ill-appearing, toxic-appearing or diaphoretic.  HENT:     Head: Normocephalic and atraumatic.     Nose: Nose normal.     Mouth/Throat:     Mouth: Mucous membranes are moist.     Pharynx: Oropharynx is clear.  Eyes:     General: No scleral icterus.    Extraocular Movements: Extraocular movements intact.  Cardiovascular:     Rate and Rhythm: Regular rhythm. Tachycardia present.     Heart sounds: Normal heart sounds. No murmur heard.    No friction rub. No gallop.  Pulmonary:     Effort: Pulmonary effort is normal. No respiratory distress.     Breath sounds: Normal breath sounds. No wheezing, rhonchi or rales.  Abdominal:     General: Bowel sounds are normal. There is no distension.     Palpations: Abdomen is soft.     Tenderness: There is no abdominal tenderness. There is no guarding or rebound.  Musculoskeletal:     Cervical back: Neck supple.     Right lower leg: No edema.      Left lower leg: No edema.  Skin:    General: Skin is warm and dry.     Coloration: Skin is not jaundiced or pale.  Neurological:     General: No focal deficit present.     Mental Status: He is alert and oriented to person, place, and time. Mental status is at baseline.  Psychiatric:        Mood and Affect: Mood normal.        Behavior: Behavior normal.        Thought Content: Thought content normal.        Judgment: Judgment normal.      Assessment:  Mr. Craig Michael is a 72 y.o. male  who presents today for Esophagogastroduodenoscopy and Colonoscopy for gerd, surveillance- phx colon polyps.  Plan:  Esophagogastroduodenoscopy and Colonoscopy with possible intervention today  Esophagogastroduodenoscopy and Colonoscopy with possible biopsy, control of bleeding, polypectomy, and interventions as necessary has been discussed with the patient/patient representative. Informed consent was  obtained from the patient/patient representative after explaining the indication, nature, and risks of the procedure including but not limited to death, bleeding, perforation, missed neoplasm/lesions, cardiorespiratory compromise, and reaction to medications. Opportunity for questions was given and appropriate answers were provided. Patient/patient representative has verbalized understanding is amenable to undergoing the procedure.   Annamaria Helling, DO  [facility]  Portions of the record may have been created with voice recognition software. Occasional wrong-word or 'sound-a-like' substitutions may have occurred due to the inherent limitations of voice recognition software.  Read the chart carefully and recognize, using context, where substitutions may have occurred.

## 2022-05-17 NOTE — Anesthesia Preprocedure Evaluation (Signed)
Anesthesia Evaluation  Patient identified by MRN, date of birth, ID band Patient awake    Reviewed: Allergy & Precautions, NPO status , Patient's Chart, lab work & pertinent test results  History of Anesthesia Complications Negative for: history of anesthetic complications  Airway Mallampati: III  TM Distance: >3 FB Neck ROM: Full    Dental no notable dental hx. (+) Teeth Intact   Pulmonary neg pulmonary ROS, neg sleep apnea, neg COPD, Patient abstained from smoking.Not current smoker, former smoker   Pulmonary exam normal breath sounds clear to auscultation       Cardiovascular Exercise Tolerance: Good METShypertension, (-) CAD and (-) Past MI (-) dysrhythmias  Rhythm:Regular Rate:Normal - Systolic murmurs    Neuro/Psych negative neurological ROS  negative psych ROS   GI/Hepatic ,GERD  Medicated,,(+)     (-) substance abuse    Endo/Other  neg diabetes    Renal/GU negative Renal ROS     Musculoskeletal  (+) Arthritis , Osteoarthritis,    Abdominal   Peds  Hematology   Anesthesia Other Findings Past Medical History: No date: Arthritis No date: GERD (gastroesophageal reflux disease) No date: Hypertension  Reproductive/Obstetrics                             Anesthesia Physical Anesthesia Plan  ASA: 3  Anesthesia Plan: General/Spinal   Post-op Pain Management: Minimal or no pain anticipated   Induction: Intravenous  PONV Risk Score and Plan: 2 and Propofol infusion and TIVA  Airway Management Planned: Natural Airway and Nasal Cannula  Additional Equipment: None  Intra-op Plan:   Post-operative Plan:   Informed Consent: I have reviewed the patients History and Physical, chart, labs and discussed the procedure including the risks, benefits and alternatives for the proposed anesthesia with the patient or authorized representative who has indicated his/her understanding and  acceptance.     Dental advisory given  Plan Discussed with: CRNA and Surgeon  Anesthesia Plan Comments: (Discussed risks of anesthesia with patient, including possibility of difficulty with spontaneous ventilation under anesthesia necessitating airway intervention, PONV, and rare risks such as cardiac or respiratory or neurological events, and allergic reactions. Discussed the role of CRNA in patient's perioperative care. Patient understands.)        Anesthesia Quick Evaluation

## 2022-05-17 NOTE — Transfer of Care (Signed)
Immediate Anesthesia Transfer of Care Note  Patient: Craig Michael  Procedure(s) Performed: COLONOSCOPY ESOPHAGOGASTRODUODENOSCOPY (EGD)  Patient Location: PACU  Anesthesia Type:General  Level of Consciousness: drowsy  Airway & Oxygen Therapy: Patient Spontanous Breathing  Post-op Assessment: Report given to RN and Post -op Vital signs reviewed and stable  Post vital signs: Reviewed and stable  Last Vitals:  Vitals Value Taken Time  BP 99/54 05/17/22 0911  Temp 35.8   Pulse 72 05/17/22 0911  Resp 17 05/17/22 0911  SpO2 98 % 05/17/22 0911  Vitals shown include unvalidated device data.  Last Pain:  Vitals:   05/17/22 0911  TempSrc:   PainSc: 0-No pain         Complications: No notable events documented.

## 2022-05-17 NOTE — Interval H&P Note (Signed)
History and Physical Interval Note: Preprocedure H&P from 05/17/22  was reviewed and there was no interval change after seeing and examining the patient.  Written consent was obtained from the patient after discussion of risks, benefits, and alternatives. Patient has consented to proceed with Esophagogastroduodenoscopy and Colonoscopy with possible intervention   05/17/2022 8:23 AM  Craig Michael  has presented today for surgery, with the diagnosis of Gastroesophageal reflux disease, unspecified whether esophagitis present (K21.9) Hx of adenomatous colonic polyps (Z86.010).  The various methods of treatment have been discussed with the patient and family. After consideration of risks, benefits and other options for treatment, the patient has consented to  Procedure(s): COLONOSCOPY (N/A) ESOPHAGOGASTRODUODENOSCOPY (EGD) (N/A) as a surgical intervention.  The patient's history has been reviewed, patient examined, no change in status, stable for surgery.  I have reviewed the patient's chart and labs.  Questions were answered to the patient's satisfaction.     Annamaria Helling

## 2022-05-17 NOTE — Op Note (Signed)
[facility] Gastroenterology Patient Name: Craig Michael Procedure Date: 05/17/2022 8:18 AM MRN: [id number] Account #: [id number] Date of Birth: 08-17-1950 Admit Type: Outpatient Age: 72 Room: [facility] ENDO ROOM 1 Gender: Male Note Status: Finalized Instrument Name: Upper Endoscope [id number] Procedure:             Upper GI endoscopy Indications:           Dysphagia, Heartburn Providers:             Rueben Bash, DO Referring MD:          Juluis Rainier (Referring MD) Medicines:             Monitored Anesthesia Care Complications:         No immediate complications. Estimated blood loss:                         Minimal. Procedure:             Pre-Anesthesia Assessment:                        - Prior to the procedure, a History and Physical was                         performed, and patient medications and allergies were                         reviewed. The patient is competent. The risks and                         benefits of the procedure and the sedation options and                         risks were discussed with the patient. All questions                         were answered and informed consent was obtained.                         Patient identification and proposed procedure were                         verified by the physician, the nurse, the anesthetist                         and the technician in the endoscopy suite. Mental                         Status Examination: alert and oriented. Airway                         Examination: normal oropharyngeal airway and neck                         mobility. Respiratory Examination: clear to                         auscultation. CV Examination: RRR, no murmurs, no S3  or S4. Prophylactic Antibiotics: The patient does not                         require prophylactic antibiotics. Prior                         Anticoagulants: The patient has taken no anticoagulant                          or antiplatelet agents. ASA Grade Assessment: II - A                         patient with mild systemic disease. After reviewing                         the risks and benefits, the patient was deemed in                         satisfactory condition to undergo the procedure. The                         anesthesia plan was to use monitored anesthesia care                         (MAC). Immediately prior to administration of                         medications, the patient was re-assessed for adequacy                         to receive sedatives. The heart rate, respiratory                         rate, oxygen saturations, blood pressure, adequacy of                         pulmonary ventilation, and response to care were                         monitored throughout the procedure. The physical                         status of the patient was re-assessed after the                         procedure.                        After obtaining informed consent, the endoscope was                         passed under direct vision. Throughout the procedure,                         the patient's blood pressure, pulse, and oxygen                         saturations were monitored continuously. The Endoscope  was introduced through the mouth, and advanced to the                         second part of duodenum. The upper GI endoscopy was                         accomplished without difficulty. The patient tolerated                         the procedure well. Findings:      The duodenal bulb, first portion of the duodenum and second portion of       the duodenum were normal. Estimated blood loss: none.      A small hiatal hernia was present. Estimated blood loss: none.      The exam of the stomach was otherwise normal.      The Z-line was regular. Estimated blood loss: none.      Esophagogastric landmarks were identified: the gastroesophageal junction       was found at 39 cm  from the incisors.      Normal mucosa was found in the entire esophagus. Biopsies were taken       with a cold forceps for histology. biopsies from mid and distal       esophagus to rule out EOE Estimated blood loss was minimal. The scope       was withdrawn. Dilation was performed with a Maloney dilator with no       resistance at 52 Fr. The dilation site was examined following endoscope       reinsertion and showed no change. Estimated blood loss: none.      The exam of the esophagus was otherwise normal. Impression:            - Normal duodenal bulb, first portion of the duodenum                         and second portion of the duodenum.                        - Small hiatal hernia.                        - Z-line regular.                        - Esophagogastric landmarks identified.                        - Normal mucosa was found in the entire esophagus.                         Biopsied. Dilated. Recommendation:        - Patient has a contact number available for                         emergencies. The signs and symptoms of potential                         delayed complications were discussed with the patient.  Return to normal activities tomorrow. Written                         discharge instructions were provided to the patient.                        - Discharge patient to home.                        - Resume previous diet.                        - Continue present medications.                        - Await pathology results.                        - Return to GI clinic as previously scheduled.                        - proceed with colonoscopy                        - The findings and recommendations were discussed with                         the patient. Procedure Code(s):     --- Professional ---                        [id number], Esophagogastroduodenoscopy, flexible,                         transoral; with biopsy, single or multiple                         43450, Dilation of esophagus, by unguided sound or                         bougie, single or multiple passes Diagnosis Code(s):     --- Professional ---                        K44.9, Diaphragmatic hernia without obstruction or                         gangrene                        R13.10, Dysphagia, unspecified                        R12, Heartburn CPT copyright 2022 American [employer]. All rights reserved. The codes documented in this report are preliminary and upon coder review may  be revised to meet current compliance requirements. Attending Participation:      I personally performed the entire procedure. Volney American, DO Annamaria Helling DO, DO 05/17/2022 8:40:01 AM This report has been signed electronically. Number of Addenda: 0 Note Initiated On: 05/17/2022 8:18 AM Estimated Blood Loss:  Estimated blood loss was minimal.      [facility]

## 2022-05-17 NOTE — Op Note (Signed)
[facility] Gastroenterology Patient Name: Suhayb Anzalone Procedure Date: 05/17/2022 8:17 AM MRN: [id number] Account #: [id number] Date of Birth: 1950-09-23 Admit Type: Outpatient Age: 72 Room: [facility] ENDO ROOM 1 Gender: Male Note Status: Finalized Instrument Name: Colonscope [id number] Procedure:             Colonoscopy Indications:           High risk colon cancer surveillance: Personal history                         of colonic polyps Providers:             Rueben Bash, DO Referring MD:          Juluis Rainier (Referring MD) Medicines:             Monitored Anesthesia Care Complications:         No immediate complications. Estimated blood loss:                         Minimal. Procedure:             Pre-Anesthesia Assessment:                        - Prior to the procedure, a History and Physical was                         performed, and patient medications and allergies were                         reviewed. The patient is competent. The risks and                         benefits of the procedure and the sedation options and                         risks were discussed with the patient. All questions                         were answered and informed consent was obtained.                         Patient identification and proposed procedure were                         verified by the physician, the nurse, the anesthetist                         and the technician in the endoscopy suite. Mental                         Status Examination: alert and oriented. Airway                         Examination: normal oropharyngeal airway and neck                         mobility. Respiratory Examination: clear to  auscultation. CV Examination: RRR, no murmurs, no S3                         or S4. Prophylactic Antibiotics: The patient does not                         require prophylactic antibiotics. Prior                          Anticoagulants: The patient has taken no anticoagulant                         or antiplatelet agents. ASA Grade Assessment: II - A                         patient with mild systemic disease. After reviewing                         the risks and benefits, the patient was deemed in                         satisfactory condition to undergo the procedure. The                         anesthesia plan was to use monitored anesthesia care                         (MAC). Immediately prior to administration of                         medications, the patient was re-assessed for adequacy                         to receive sedatives. The heart rate, respiratory                         rate, oxygen saturations, blood pressure, adequacy of                         pulmonary ventilation, and response to care were                         monitored throughout the procedure. The physical                         status of the patient was re-assessed after the                         procedure.                        After obtaining informed consent, the colonoscope was                         passed under direct vision. Throughout the procedure,                         the patient's blood pressure, pulse, and oxygen  saturations were monitored continuously. The                         Colonoscope was introduced through the anus and                         advanced to the the terminal ileum, with                         identification of the appendiceal orifice and IC                         valve. The colonoscopy was performed without                         difficulty. The patient tolerated the procedure well.                         The quality of the bowel preparation was evaluated                         using the BBPS [facility] Bowel Preparation Scale) with                         scores of: Right Colon = 3, Transverse Colon = 3 and                         Left Colon = 3 (entire  mucosa seen well with no                         residual staining, small fragments of stool or opaque                         liquid). The total BBPS score equals 9. The terminal                         ileum, ileocecal valve, appendiceal orifice, and                         rectum were photographed. Findings:      The perianal and digital rectal examinations were normal. Pertinent       negatives include normal sphincter tone.      The terminal ileum appeared normal. Estimated blood loss: none.      Two sessile polyps were found in the rectum and transverse colon. The       polyps were 1 to 2 mm in size. These polyps were removed with a jumbo       cold forceps. Resection and retrieval were complete. Estimated blood       loss was minimal.      Multiple large-mouthed and small-mouthed diverticula were found in the       left colon. Estimated blood loss: none.      Non-bleeding internal hemorrhoids were found during retroflexion. The       hemorrhoids were Grade I (internal hemorrhoids that do not prolapse).      The exam was otherwise without abnormality on direct and retroflexion       views. Impression:            -  The examined portion of the ileum was normal.                        - Two 1 to 2 mm polyps in the rectum and in the                         transverse colon, removed with a jumbo cold forceps.                         Resected and retrieved.                        - Diverticulosis in the left colon.                        - Non-bleeding internal hemorrhoids.                        - The examination was otherwise normal on direct and                         retroflexion views. Recommendation:        - Patient has a contact number available for                         emergencies. The signs and symptoms of potential                         delayed complications were discussed with the patient.                         Return to normal activities tomorrow. Written                          discharge instructions were provided to the patient.                        - Discharge patient to home.                        - Resume previous diet.                        - Continue present medications.                        - Await pathology results.                        - Repeat colonoscopy for surveillance based on                         pathology results.                        - Return to referring physician as previously                         scheduled.                        -  The findings and recommendations were discussed with                         the patient. Procedure Code(s):     --- Professional ---                        [phone], Colonoscopy, flexible; with biopsy, single or                         multiple Diagnosis Code(s):     --- Professional ---                        Z86.010, Personal history of colonic polyps                        K64.0, First degree hemorrhoids                        D12.8, Benign neoplasm of rectum                        D12.3, Benign neoplasm of transverse colon (hepatic                         flexure or splenic flexure)                        K57.30, Diverticulosis of large intestine without                         perforation or abscess without bleeding CPT copyright 2022 American [employer] Association. All rights reserved. The codes documented in this report are preliminary and upon coder review may  be revised to meet current compliance requirements. Attending Participation:      I personally performed the entire procedure. Volney American, DO Annamaria Helling DO, DO 05/17/2022 9:12:58 AM This report has been signed electronically. Number of Addenda: 0 Note Initiated On: 05/17/2022 8:17 AM Scope Withdrawal Time: 0 hours 13 minutes 47 seconds  Total Procedure Duration: 0 hours 24 minutes 31 seconds  Estimated Blood Loss:  Estimated blood loss was minimal.      [facility]

## 2022-05-18 ENCOUNTER — Encounter: Payer: Self-pay | Admitting: Gastroenterology

## 2022-05-18 LAB — SURGICAL PATHOLOGY
